# Patient Record
Sex: Female | Born: 1960 | Race: White | Hispanic: No | Marital: Married | Smoking: Never smoker
Health system: Southern US, Community
[De-identification: ages and names within clinical notes are randomized; demographics above are authoritative.]

## PROBLEM LIST (undated history)

## (undated) DIAGNOSIS — Z9889 Other specified postprocedural states: Secondary | ICD-10-CM

## (undated) DIAGNOSIS — R51 Headache: Secondary | ICD-10-CM

## (undated) DIAGNOSIS — R112 Nausea with vomiting, unspecified: Secondary | ICD-10-CM

## (undated) DIAGNOSIS — R7303 Prediabetes: Secondary | ICD-10-CM

## (undated) DIAGNOSIS — D219 Benign neoplasm of connective and other soft tissue, unspecified: Secondary | ICD-10-CM

## (undated) DIAGNOSIS — I10 Essential (primary) hypertension: Secondary | ICD-10-CM

## (undated) HISTORY — DX: Prediabetes: R73.03

## (undated) HISTORY — PX: EYE SURGERY: SHX253

## (undated) HISTORY — DX: Essential (primary) hypertension: I10

## (undated) HISTORY — DX: Benign neoplasm of connective and other soft tissue, unspecified: D21.9

## (undated) HISTORY — PX: BREAST REDUCTION SURGERY: SHX8

---

## 2000-08-19 HISTORY — PX: CYST EXCISION: SHX5701

## 2005-08-19 HISTORY — PX: CHOLECYSTECTOMY: SHX55

## 2011-05-20 HISTORY — PX: CATARACT EXTRACTION: SUR2

## 2012-09-08 DIAGNOSIS — K5792 Diverticulitis of intestine, part unspecified, without perforation or abscess without bleeding: Secondary | ICD-10-CM | POA: Insufficient documentation

## 2012-09-08 DIAGNOSIS — G43909 Migraine, unspecified, not intractable, without status migrainosus: Secondary | ICD-10-CM | POA: Insufficient documentation

## 2013-11-02 ENCOUNTER — Ambulatory Visit (INDEPENDENT_AMBULATORY_CARE_PROVIDER_SITE_OTHER): Payer: Commercial Managed Care - PPO | Admitting: Gynecology

## 2013-11-02 ENCOUNTER — Encounter: Payer: Self-pay | Admitting: Gynecology

## 2013-11-02 VITALS — BP 143/95 | HR 78 | Resp 18 | Ht 62.5 in | Wt 199.0 lb

## 2013-11-02 DIAGNOSIS — N970 Female infertility associated with anovulation: Secondary | ICD-10-CM | POA: Insufficient documentation

## 2013-11-02 DIAGNOSIS — Z8742 Personal history of other diseases of the female genital tract: Secondary | ICD-10-CM

## 2013-11-02 DIAGNOSIS — IMO0001 Reserved for inherently not codable concepts without codable children: Secondary | ICD-10-CM

## 2013-11-02 DIAGNOSIS — R030 Elevated blood-pressure reading, without diagnosis of hypertension: Secondary | ICD-10-CM

## 2013-11-02 DIAGNOSIS — Z01419 Encounter for gynecological examination (general) (routine) without abnormal findings: Secondary | ICD-10-CM

## 2013-11-02 DIAGNOSIS — E669 Obesity, unspecified: Secondary | ICD-10-CM | POA: Insufficient documentation

## 2013-11-02 DIAGNOSIS — Z0000 Encounter for general adult medical examination without abnormal findings: Secondary | ICD-10-CM

## 2013-11-02 DIAGNOSIS — Z1211 Encounter for screening for malignant neoplasm of colon: Secondary | ICD-10-CM

## 2013-11-02 DIAGNOSIS — N950 Postmenopausal bleeding: Secondary | ICD-10-CM | POA: Insufficient documentation

## 2013-11-02 LAB — HEMOGLOBIN, FINGERSTICK: Hemoglobin, fingerstick: 14.4 g/dL (ref 12.0–16.0)

## 2013-11-02 NOTE — Patient Instructions (Signed)

## 2013-11-02 NOTE — Progress Notes (Addendum)
53 y.o. Married Caucasian female   G0P0000 here for annual exam. Pt reports menses are irregular.  She does not report hot flashes, does not have night sweats, does have vaginal dryness.  She is using lubricants, [place] Jelly.  She does report post-menopasual bleeding.   Pt had an EMB 12/13 for PMB and did not bleed again until 2/15.  Pt describes the bleeding as lasting over 2w, bright red and now is brown.  Pt was wearing mostly pantiliner.  Pt had livelong irregular menses, husband with female factor infertility, pt denies treatment for her cycles.  Pt was having her cycle every 3-32m but sometimes twice a month.  Pt denies evaluation for PCOS or use of ocp or progestins.    Patient's last menstrual period was 10/04/2013.          Sexually active: yes  The current method of family planning is none.    Exercising: yes  walking 3-4x/wk Last pap: 12/13-neg, annual records reviewed 2010-2013 Abnormal PAP: no Mammogram: 03/2012 BSE: yes  Colonoscopy: none  DEXA: none Alcohol: no Tobacco: no  Hgb: 14.4  ; Urine: Unable to void  No health maintenance topics applied.  Family History  Problem Relation Age of Onset  . Cancer Mother     Ovarian/Cevical?  . Lung cancer Mother   . Lung cancer Father   . Lung cancer Maternal Uncle   . Heart attack Father 3  . Stroke Father     Late 5's  . Hypertension Father   . Heart attack Paternal Grandmother   . Congestive Heart Failure Paternal Grandmother     There are no active problems to display for this patient.   Past Medical History  Diagnosis Date  . Fibroid     Past Surgical History  Procedure Laterality Date  . Cataract extraction  05/2011  . Cholecystectomy  2007  . Cyst excision Left 2002    Sebaceous removed from Breast    Allergies: Codeine  Current Outpatient Prescriptions  Medication Sig Dispense Refill  . Cholecalciferol (VITAMIN D3) 2000 UNITS capsule Take 2,000 Units by mouth daily.      . fluticasone (FLONASE) 50  MCG/ACT nasal spray Place into both nostrils as needed for allergies or rhinitis.      Marland Kitchen MAGNESIUM PO Take by mouth.      . Multiple Vitamins-Minerals (MULTIVITAMIN PO) Take by mouth.      . Omega-3 Fatty Acids (FISH OIL PO) Take 1,000 mg by mouth.      . phentermine 37.5 MG capsule Take 37.5 mg by mouth every morning.      . SUMAtriptan (IMITREX) 100 MG tablet Take 100 mg by mouth as needed for migraine or headache. May repeat in 2 hours if headache persists or recurs.      . SUMAtriptan (IMITREX) 6 MG/0.5ML SOLN injection Inject 6 mg into the skin as needed for migraine or headache. May repeat in 2 hours if headache persists or recurs.       No current facility-administered medications for this visit.    ROS: Pertinent items are noted in HPI.  Exam:    BP 143/95  Pulse 78  Resp 18  Ht 5' 2.5" (1.588 m)  Wt 199 lb (90.266 kg)  BMI 35.80 kg/m2  LMP 10/04/2013 Weight change: @WEIGHTCHANGE @ Last 3 height recordings:  Ht Readings from Last 3 Encounters:  11/02/13 5' 2.5" (1.588 m)   General appearance: alert, cooperative and appears stated age Head: Normocephalic, without obvious abnormality, atraumatic  Neck: no adenopathy, no carotid bruit, no JVD, supple, symmetrical, trachea midline and thyroid not enlarged, symmetric, no tenderness/mass/nodules Lungs: clear to auscultation bilaterally Breasts: normal appearance, no masses or tenderness Heart: regular rate and rhythm, S1, S2 normal, no murmur, click, rub or gallop Abdomen: soft, non-tender; bowel sounds normal; no masses,  no organomegaly Extremities: extremities normal, atraumatic, no cyanosis or edema Skin: Skin color, texture, turgor normal. No rashes or lesions Lymph nodes: Cervical, supraclavicular, and axillary nodes normal. no inguinal nodes palpated Neurologic: Grossly normal   Pelvic: External genitalia:  no lesions              Urethra: normal appearing urethra with no masses, tenderness or lesions               Bartholins and Skenes: Bartholin's, Urethra, Skene's normal                 Vagina: normal appearing vagina with normal color and discharge, no lesions              Cervix: normal appearance              Pap taken: no        Bimanual Exam:  Uterus:  uterus is normal size, shape, consistency and nontender                                      Adnexa:    no masses                                      Rectovaginal: Confirms                                      Anus:  normal sphincter tone, no lesions  A: well woman Postmenopausal bleed Chronic anovulation Obesity Elevated BP on phnetiramine      P: mammogram pap smear not done, review of 5y neg PAP's.  New guidelines discussed, will be due 2016 Referral for colon cancer screening SHG/EMB recommended for PMB.  Discussed risks with pt of chronic anovulation, possible PCOS, obesity and infertility for endometrial hyperplasias and/or cancer.  Pt had PUS done with EMB 20mm, EMB results NA, we discussed need for repeat u/s and SHG added.  Based on findings biopsy may be done.  Pictures drawn.  Questions addressed Will get HBA1C due to weight and irregular menses Elevated BP today-pt on phentiramine for 48m, will see prescribing MD this week for f/u, lost 14#,  Pt reports being on in 2006 and lost 80#, had gained 50 back. Risks of BP disucssed counseled on breast self exam, mammography screening, menopause, adequate intake of calcium and vitamin D, diet and exercise return annually or prn Discussed PAP guideline changes, importance of weight bearing exercises, calcium, vit D and balanced diet.  Additional 66m spent discussing risks and evaluation of PMB and chronic anovulation, >50% face to face  An After Visit Summary was printed and given to the patient.   EMB results:  07/25/12-extremely scant strips of benign surface endometrial epithelium

## 2013-11-03 ENCOUNTER — Telehealth: Payer: Self-pay | Admitting: Gynecology

## 2013-11-03 LAB — HEMOGLOBIN A1C
Hgb A1c MFr Bld: 5.8 % — ABNORMAL HIGH (ref <5.7)
Mean Plasma Glucose: 120 mg/dL — ABNORMAL HIGH (ref <117)

## 2013-11-03 NOTE — Telephone Encounter (Signed)
Spoke with patient. Advised of $316.28 responsibility for [facility] and Endo [facility]. Scheduled procedures. Advised patient of cancellation policy and cancellation fee. Patient agreeable.

## 2013-11-03 NOTE — Addendum Note (Signed)
Addended by: Alfonzo Feller on: 11/03/2013 08:40 AM   Modules accepted: Orders

## 2013-11-09 ENCOUNTER — Telehealth: Payer: Self-pay | Admitting: Gynecology

## 2013-11-09 NOTE — Telephone Encounter (Signed)
Spoke with patient. Message from Dr. Charlies Constable given. Patient verbalizes understanding.   Routing to provider for final review. Patient agreeable to disposition. Will close encounter

## 2013-11-09 NOTE — Telephone Encounter (Signed)
Per Reuben Likes, patient is scheduled 04.16 @ 1600 with a nurse for referral visit.

## 2013-11-09 NOTE — Telephone Encounter (Signed)
Message copied by Jasmine Awe on Tue Nov 09, 2013  9:31 AM ------      Message from: Elveria Rising      Created: Thu Nov 04, 2013 10:28 AM       Not fasting but suggestive of pre-diabetic, can discuss at PUS if desires ------

## 2013-11-23 ENCOUNTER — Other Ambulatory Visit: Payer: Self-pay | Admitting: Gynecology

## 2013-11-23 ENCOUNTER — Encounter: Payer: Self-pay | Admitting: Gynecology

## 2013-11-23 ENCOUNTER — Ambulatory Visit (INDEPENDENT_AMBULATORY_CARE_PROVIDER_SITE_OTHER): Payer: Commercial Managed Care - PPO | Admitting: Gynecology

## 2013-11-23 ENCOUNTER — Ambulatory Visit (INDEPENDENT_AMBULATORY_CARE_PROVIDER_SITE_OTHER): Payer: Commercial Managed Care - PPO

## 2013-11-23 VITALS — BP 112/78 | Resp 20 | Ht 62.5 in | Wt 197.0 lb

## 2013-11-23 DIAGNOSIS — N950 Postmenopausal bleeding: Secondary | ICD-10-CM

## 2013-11-23 NOTE — Progress Notes (Signed)
U/s images reviewed. Uterus normal, EMS 2.45, ovaries c/w menopause, no free fluid Although lining is thin, pt did have 2w bleed and minimal tissue on prior biopsy, suggest we proceed with SHG and EMB.  Pt agrees and consent signed.  Speculum placed, cervix visualized, cleansed with betadine x3, insemination catheter advanced with ease and walls gently distended.  approx 1cc fluid used.  No endometrial defects noted.  Endometrial pipelle advanced with u/s guidance as min resistance met, minimal tissue obtained on 2 passes, pt tolerated well, tissue to pathology. Of note, BP better today, still on phentiramine 

## 2013-11-26 LAB — IPS OTHER TISSUE BIOPSY

## 2013-11-30 ENCOUNTER — Telehealth: Payer: Self-pay | Admitting: Gynecology

## 2013-11-30 NOTE — Telephone Encounter (Signed)
Pt returned call, expalined that endometrial biopsy showed n -PREDOMINANTLY MUCUS WITH INTERSPERSED FRAGMENTS OF BENIGN ATROPHIC-APPEARING ENDOMETRIAL GLANDS AND STRIPS OF BENIGN ATROPHIC-APPEARING ENDOMETRIAL GLANDULAR EPITHELIUM WITH CILIARY METAPLASIA; NO INTACT ENDOMETRIUM IDENTIFIED As this was her second bleed in menopause, and both mine and her prior biopsy did not produce sufficient information, i suggest we do a hysteroscopy, procedure and risks and benefits briefly reviewed and pt is agreeable, we will precert and discuss more formally before OR

## 2013-11-30 NOTE — Telephone Encounter (Signed)
LM RE RESULTS, ASKED TO CALL BACK

## 2013-11-30 NOTE — Telephone Encounter (Signed)
Opened in error

## 2013-12-06 ENCOUNTER — Telehealth: Payer: Self-pay | Admitting: Gynecology

## 2013-12-06 NOTE — Telephone Encounter (Signed)
Spoke with patient. Advised that per benefits quote received, she will be responsible for $129.48 for surgeons fees. Payment is due 2 weeks prior to the scheduled surgery date. She will receive separate phone call regarding facility charges/benefits. Patient prefers surgery after June 5th.

## 2014-01-05 ENCOUNTER — Telehealth: Payer: Self-pay | Admitting: Orthopedic Surgery

## 2014-01-05 NOTE — Telephone Encounter (Signed)
LMTCB for surgical instructions and scheduling appts.

## 2014-01-05 NOTE — Telephone Encounter (Signed)
Spoke with pt about surgery date of 01-24-14, and pt unable to accommodate that day. Pt says she is off the whole week of 02-14-14 and her only appt that week is 02-15-14 at 10 am. Advised pt that we only had instructions to put surgery after 01-21-14, and we would have to arrange another surgery date and call her back. Pt apologetic and appreciative.

## 2014-01-14 ENCOUNTER — Telehealth: Payer: Self-pay | Admitting: Gynecology

## 2014-01-14 NOTE — Telephone Encounter (Signed)
MAILED LETTER TO PATIENT  Jan 14, 2014   Dear Ms. Louis Meckel,  Your surgery is scheduled for February 14, 2014.  Upon requesting authorization for your surgery, your insurance company has informed us that they will cover 80% of the charges, and you will be responsible to pay approximately $129.48.  It is our office policy that this amount is paid in full two weeks prior to your surgery. Your payment is due on June 15.  If there is a balance due after your insurance company pays their portion, we will send you a bill.  If there is a refund due to you, we will send you a check within one month.  Payment may be made by cash, check, Visa, Nurse, learning disability. Payment can be made in the office or over the telephone.  If payment is not made two weeks prior to your surgery, we will have to reschedule your surgery.  The above fee includes only our fee for the surgery and does not include charges you may have from the facility, anesthesia or pathology.  If you have any questions, please call us at [phone].

## 2014-01-14 NOTE — Telephone Encounter (Signed)
Pt calling to schedule surgery.

## 2014-01-14 NOTE — Telephone Encounter (Signed)
Call to patient and advised surgery rescheduled to 02-14-14 at 1130 at [facility] (patient previously scheduled for 01-24-14 but declined this date). Patient states she has an appointmnet on 02-15-14 at 1000. Advised she should not drive for 24 hours post op and with surgery on 02-14-14, this would concern me.  Patient requests to change to 02-16-14.  Call to Central Scheduling. Spoke with [facility] and case moved to 02-16-14 at 1030.  Surgery instruction sheet reviewed and mailed.  Call prn.  Routing to provider for final review. Patient agreeable to disposition. Will close encounter

## 2014-01-21 HISTORY — PX: COLONOSCOPY: SHX174

## 2014-01-26 ENCOUNTER — Ambulatory Visit (INDEPENDENT_AMBULATORY_CARE_PROVIDER_SITE_OTHER): Payer: Commercial Managed Care - PPO | Admitting: Gynecology

## 2014-01-26 ENCOUNTER — Encounter: Payer: Self-pay | Admitting: Gynecology

## 2014-01-26 VITALS — BP 130/92 | HR 80 | Resp 18 | Ht 62.5 in | Wt 188.0 lb

## 2014-01-26 DIAGNOSIS — N950 Postmenopausal bleeding: Secondary | ICD-10-CM

## 2014-01-26 MED ORDER — MISOPROSTOL 200 MCG PO TABS: ORAL_TABLET | ORAL | Status: DC

## 2014-01-26 NOTE — Progress Notes (Signed)
  53 y.o.Married Not Hispanic or Latino female presents for preoperative consult for D&C hysteroscopy forabnormal uterine bleeding and post-menopausal.  Pt reports long history of irregular cycles, LMP 11/13, she had been seen by prior gyn for bleeding, although EMS was 70mm, her biopsy was insufficient, she was seen here reporting bleeding 2/15.  PUS showed ems 2.45, EMB with mucous and no intact endometrial cells.  Pt reports feeling like she is going to start her cycle now, crampy and mild mastalgia as well as change in vaginal discharge.   Pertinent items are noted in HPI.  BP 130/92  Pulse 80  Resp 18  Ht 5' 2.5" (1.588 m)  Wt 188 lb (85.276 kg)  BMI 33.82 kg/m2  LMP 10/04/2013 General appearance: alert, cooperative and appears stated age Lungs: clear to auscultation bilaterally Heart: regular rate and rhythm, S1, S2 normal, no murmur, click, rub or gallop Abdomen: soft, non-tender; bowel sounds normal; no masses,  no organomegaly Pelvic: cervix normal in appearance, external genitalia normal, no adnexal masses or tenderness, no cervical motion tenderness, uterus normal size, shape, and consistency and vagina normal without discharge Extremities: edema pedal Lymph nodes: Cervical, supraclavicular, and axillary nodes normal.  The procedure was discussed at length.  Her risks for dysplasia were reviewed, an/oligoovulation, obesity, infertility Pt informed regarding risks and benefits of surgery, including but not limited to bleeding, infections, damage to bowel or bladder due to uterine perforation either during the procedure or during dilation. cytotec will  be given preoperatively to soften the cervix.    There is a risk of formation of a deep vein thrombus in the extremities was discussed, although PAS will be placed to minimize the risk, the patient was informed that a pulmonary embolism could still form and could result in death.  She was instructed on signs and symptoms to be aware of  and the need to call if they should develop.  Fluid overload from the distending media was discussed as was the usual intra-operative safety precautions in place.  All questions were addressed.  Post-operative medications NSAID were not given to the patient.

## 2014-01-28 ENCOUNTER — Encounter: Payer: Self-pay | Admitting: Gynecology

## 2014-01-28 LAB — FOLLICLE STIMULATING HORMONE: FSH: 32.3 m[IU]/mL

## 2014-01-31 ENCOUNTER — Telehealth: Payer: Self-pay | Admitting: *Deleted

## 2014-01-31 NOTE — Telephone Encounter (Signed)
Message copied by Jaymes Graff on Mon Jan 31, 2014  5:06 PM ------      Message from: Elveria Rising      Created: Mon Jan 31, 2014 11:13 AM       Pt is canceling her surgery for July, can we reach her for a consult?      TL ------

## 2014-01-31 NOTE — Telephone Encounter (Signed)
Call to patient, confirmed she desires to cancel surgery since she is having menses on her own. Advised of recommendation to schedule consult to discuss treatment plan going forward. Brief explanation of why this is recommended, need to consider induction of cycle if no spontaneous bleeding every three months to prevent possible development of abnormal cells in uterus. Also may need to discuss any contraception needs. Patient will call back to schedule as she is going on vacation and will probably wait till then to make appointment.

## 2014-01-31 NOTE — Telephone Encounter (Signed)
See next phone message. Spoke to patient to confirm cancel surgery. Needs to schedule follow-up.  Routing to provider for final review. Patient agreeable to disposition. Will close encounter

## 2014-01-31 NOTE — Telephone Encounter (Signed)
Patient calling stating Dr. Charlies Constable told her through [place] she will not be needing surgery after all. Patient calling to cancel upcoming surgery and all associated appointments. Patient wants [place] to please let her know if there is anything further she needs to do. Patient also requests refund of $129.48 she paid for a surgery pre-pay.  Cc: Gabriel Cirri

## 2014-02-02 NOTE — Telephone Encounter (Signed)
Reviewed with Dr Charlies Constable who offered to change post op appointment to a consult. Patient is agreeable to this.  Appt 03-02-14 at 530 for consult  (instead of post-op).  Routing to provider for final review. Patient agreeable to disposition. Will close encounter

## 2014-02-14 ENCOUNTER — Encounter: Payer: Self-pay | Admitting: Gynecology

## 2014-02-16 ENCOUNTER — Ambulatory Visit (HOSPITAL_COMMUNITY): Admission: RE | Admit: 2014-02-16 | Payer: Self-pay | Source: Ambulatory Visit | Admitting: Gynecology

## 2014-02-16 ENCOUNTER — Encounter (HOSPITAL_COMMUNITY): Admission: RE | Payer: Self-pay | Source: Ambulatory Visit

## 2014-02-16 SURGERY — DILATATION & CURETTAGE/HYSTEROSCOPY WITH TRUCLEAR
Anesthesia: Choice

## 2014-02-22 ENCOUNTER — Encounter: Payer: Self-pay | Admitting: Gynecology

## 2014-02-23 ENCOUNTER — Telehealth: Payer: Self-pay | Admitting: Gynecology

## 2014-02-23 NOTE — Telephone Encounter (Signed)
Routing to Dr. Charlies Constable.

## 2014-02-23 NOTE — Telephone Encounter (Signed)
Patient canceled her dr/cx/rs/appointment for consult/follow up she states that day and time is not good for her. She did not reschedule her appointment at this time but stated she would call back at a later time to reschedule.

## 2014-03-02 ENCOUNTER — Ambulatory Visit: Payer: Commercial Managed Care - PPO | Admitting: Gynecology

## 2014-03-07 ENCOUNTER — Ambulatory Visit: Payer: Commercial Managed Care - PPO | Admitting: Gynecology

## 2014-03-10 NOTE — Telephone Encounter (Signed)
Per Dr. Charlies Constable, please call pt to offer a 5 PM appointment with her for a consult at patient's convenience.   Work number, did not leave message.  Message left to return call to [place] at [phone] on Mobile.

## 2014-03-11 NOTE — Telephone Encounter (Addendum)
Spoke with patient. Advised of message from Dr.Lathrop. Patient is very grateful. Patient would like appointment for Wednesday or Thursday in August. Advised Thursdays are Dr.Lathop's off day. Requesting appointment for Wednesday 8/12. Appointment scheduled for Wednesday 8/12 at 5pm with Dr.Lathrop.  Routing to provider for final review. Patient agreeable to disposition. Will close encounter

## 2014-03-30 ENCOUNTER — Ambulatory Visit (INDEPENDENT_AMBULATORY_CARE_PROVIDER_SITE_OTHER): Payer: Commercial Managed Care - PPO | Admitting: Gynecology

## 2014-03-30 ENCOUNTER — Encounter: Payer: Self-pay | Admitting: Gynecology

## 2014-03-30 VITALS — BP 148/98 | Resp 20 | Ht 62.5 in | Wt 189.0 lb

## 2014-03-30 DIAGNOSIS — N950 Postmenopausal bleeding: Secondary | ICD-10-CM

## 2014-03-30 DIAGNOSIS — Z8742 Personal history of other diseases of the female genital tract: Secondary | ICD-10-CM

## 2014-03-30 MED ORDER — MISOPROSTOL 200 MCG PO TABS: ORAL_TABLET | ORAL | Status: DC

## 2014-03-30 NOTE — Patient Instructions (Signed)
Take a cytotec in the evening before and morning of sugery

## 2014-03-30 NOTE — Progress Notes (Signed)
53 y.o.Married caucasian female presents for consultation.  She was scheduled for a D&C hysteroscopy for what was thought to be PMB but pt had an [place] in the upper normal range, had breast tenderness and did have a cycle so she cancelled. We asked her to come back for consultation today.  She has not had a cycle since June.   Pt reports long history of irregular cycles, prior LMP 11/13, she had been seen by prior gyn for bleeding, although EMS was 4mm, her biopsy was insufficient, she was seen here reporting bleeding 2/15. PUS showed ems 2.45, EMB with mucous and no intact endometrial cells. We discussed the risks or prolonged amenorrhea and anovulation, the effects of exogenous estrogen on her lining and what is felt to be 2 inadequate biopsies.  I strongly encourage her to reconsider surgical evaluation.  Pertinent items are noted in HPI.   BP 148/98  Pulse 80  Resp 18  Ht 5' 2.5" (1.588 m)  Wt 189 lb (85.276 kg)  BMI 33.82 kg/m2  LMP 06//2015  General appearance: alert, cooperative and appears stated age  Lungs: clear to auscultation bilaterally  Heart: regular rate and rhythm, S1, S2 normal, no murmur, click, rub or gallop  Abdomen: soft, non-tender; bowel sounds normal; no masses, no organomegaly  Pelvic: cervix normal in appearance, external genitalia normal, no adnexal masses or tenderness, no cervical motion tenderness, uterus normal size, shape, and consistency and vagina normal without discharge  Extremities: edema pedal    The procedure was discussed at length. Her risks for dysplasia were reviewed, an/oligoovulation, obesity, infertility  Pt informed regarding risks and benefits of surgery, including but not limited to bleeding, infections, damage to bowel or bladder due to uterine perforation either during the procedure or during dilation. cytotec will be given preoperatively to soften the cervix.  There is a risk of formation of a deep vein thrombus in the extremities was  discussed, although PAS will be placed to minimize the risk, the patient was informed that a pulmonary embolism could still form and could result in death. She was instructed on signs and symptoms to be aware of and the need to call if they should develop.  Fluid overload from the distending media was discussed as was the usual intra-operative safety precautions in place.  If her pathology is normal i suggest we treat her periodically with progestin to shed any residual lining until her [facility] is in menopausal range and she is agreeable.  All questions were addressed.

## 2014-03-31 ENCOUNTER — Telehealth: Payer: Self-pay | Admitting: *Deleted

## 2014-03-31 NOTE — Telephone Encounter (Signed)
Call to patient regarding surgery dates. Patient discussed 04-29-14 with Dr Charlies Constable but hospital is not able to accommodate this date.  Discussed other date options. Requests a Friday 04-29-14 anytime this day, explained hosp schedule and Dr lathrop schedule do not work that day. [place] declines Friday 05-06-14. Will post for 05-13-14 at 730 and call her back with precert information once case confirmed.

## 2014-04-12 ENCOUNTER — Encounter: Payer: Self-pay | Admitting: Gynecology

## 2014-04-12 NOTE — Telephone Encounter (Signed)
Patient returned call. I explained to her that per the benefit quote received, she will be responsible for $129.87 for the surgeon portion of her surgery. I also explained that per our office policy, this payment is due in full at least 2 weeks prior to her scheduled surgery date. Surgery is scheduled for 09.25.2015.Marland KitchenMarland Kitchenpayment is due 09.11.2015. Patient agreeable. States that she will call in this payment.

## 2014-04-12 NOTE — Telephone Encounter (Signed)
Left message for patient to call back. Need to go over surgery benefits. °

## 2014-04-18 NOTE — Telephone Encounter (Signed)
MAILED SURGERY FEE LETTER TO PATIENT:: April 18, 2014      Dear Ms. Wendie Simmer,  Your surgery is scheduled May 13, 2014.   Upon requesting authorization for your surgery, your insurance company has informed us that they will cover 80% of the charges, and you will be responsible to pay approximately $129.87 . It is our office policy that this amount is paid in full two weeks prior to your surgery. Your payment is due September 11, 2015_.  If there is a balance due after your insurance company pays their portion, we will send you a bill.  If there is a refund due to you, we will send you a check within one month.  Payment may be made by cash, check, Visa, MasterCard or American Express.  If payment is not made two weeks prior to your surgery, we will have to reschedule your surgery.  The above fee includes only our fee for the surgery and does not include charges you may have from the facility, anesthesia or pathology.  If you have any questions, please call us at [phone].

## 2014-04-27 NOTE — Telephone Encounter (Signed)
Review surgery information and instruction with the pt.  Mailed copy to the address in chart

## 2014-04-28 NOTE — Telephone Encounter (Signed)
Pt wants to talk with Gay Filler about a medication she needs to take for surgery.

## 2014-04-29 ENCOUNTER — Encounter (HOSPITAL_COMMUNITY): Payer: Self-pay

## 2014-04-29 NOTE — Telephone Encounter (Signed)
Return call to patient. Calling to confirm instruction regarding pill for surgery. Instructed to take the one Cytotec evening before procedure. Advised surgery has been moved to 9am on 05-13-14 (time change only).  Routing to provider for final review. Patient agreeable to disposition. Will close encounter

## 2014-04-29 NOTE — Telephone Encounter (Signed)
Return call to patient, LMTCB.  

## 2014-04-29 NOTE — Telephone Encounter (Signed)
Patient will try back again at 3:00 PM.

## 2014-05-09 ENCOUNTER — Encounter (HOSPITAL_COMMUNITY)
Admission: RE | Admit: 2014-05-09 | Discharge: 2014-05-09 | Disposition: A | Discharge status: Home / Self Care | Payer: 59 | Source: Ambulatory Visit | Attending: Gynecology | Admitting: Gynecology

## 2014-05-09 ENCOUNTER — Encounter (HOSPITAL_COMMUNITY): Payer: Self-pay

## 2014-05-09 DIAGNOSIS — N938 Other specified abnormal uterine and vaginal bleeding: Secondary | ICD-10-CM | POA: Insufficient documentation

## 2014-05-09 DIAGNOSIS — N949 Unspecified condition associated with female genital organs and menstrual cycle: Secondary | ICD-10-CM | POA: Insufficient documentation

## 2014-05-09 DIAGNOSIS — Z01812 Encounter for preprocedural laboratory examination: Secondary | ICD-10-CM | POA: Insufficient documentation

## 2014-05-09 DIAGNOSIS — N925 Other specified irregular menstruation: Secondary | ICD-10-CM | POA: Diagnosis not present

## 2014-05-09 HISTORY — DX: Nausea with vomiting, unspecified: R11.2

## 2014-05-09 HISTORY — DX: Headache: R51

## 2014-05-09 HISTORY — DX: Other specified postprocedural states: Z98.890

## 2014-05-09 LAB — CBC
HCT: 40.6 % (ref 36.0–46.0)
Hemoglobin: 13.9 g/dL (ref 12.0–15.0)
MCH: 31 pg (ref 26.0–34.0)
MCHC: 34.2 g/dL (ref 30.0–36.0)
MCV: 90.6 fL (ref 78.0–100.0)
Platelets: 345 10*3/uL (ref 150–400)
RBC: 4.48 MIL/uL (ref 3.87–5.11)
RDW: 12.6 % (ref 11.5–15.5)
WBC: 8.8 10*3/uL (ref 4.0–10.5)

## 2014-05-09 NOTE — Patient Instructions (Addendum)
   Your procedure is scheduled on:  Friday, Sept 25  Enter through the [employer] [facility] at: [place] up the phone at the desk and dial [phone] and inform us of your arrival.  Please call this number if you have any problems the morning of surgery: [phone]  Remember: Do not eat or drink after midnight:  Thursday Take these medicines the morning of surgery with a SIP OF WATER:  None  Do not wear jewelry, make-up, or FINGER nail polish No metal in your hair or on your body. Do not wear lotions, powders, perfumes.  You may wear deodorant.  Do not bring valuables to the hospital. Contacts, dentures or bridgework may not be worn into surgery.  Patients discharged on the day of surgery will not be allowed to drive home.  Home with husband Joneen Caraway cell [phone]

## 2014-05-13 ENCOUNTER — Encounter (HOSPITAL_COMMUNITY): Payer: 59 | Admitting: Anesthesiology

## 2014-05-13 ENCOUNTER — Encounter (HOSPITAL_COMMUNITY)
Admission: RE | Disposition: A | Discharge status: Home / Self Care | Payer: Self-pay | Source: Ambulatory Visit | Attending: Gynecology

## 2014-05-13 ENCOUNTER — Ambulatory Visit (HOSPITAL_COMMUNITY)
Admission: RE | Admit: 2014-05-13 | Discharge: 2014-05-13 | Disposition: A | Discharge status: Home / Self Care | Payer: 59 | Source: Ambulatory Visit | Attending: Gynecology | Admitting: Gynecology

## 2014-05-13 ENCOUNTER — Ambulatory Visit (HOSPITAL_COMMUNITY): Payer: 59 | Admitting: Anesthesiology

## 2014-05-13 DIAGNOSIS — E282 Polycystic ovarian syndrome: Secondary | ICD-10-CM | POA: Insufficient documentation

## 2014-05-13 DIAGNOSIS — Z9889 Other specified postprocedural states: Secondary | ICD-10-CM

## 2014-05-13 DIAGNOSIS — N938 Other specified abnormal uterine and vaginal bleeding: Secondary | ICD-10-CM

## 2014-05-13 DIAGNOSIS — N950 Postmenopausal bleeding: Secondary | ICD-10-CM | POA: Insufficient documentation

## 2014-05-13 DIAGNOSIS — N949 Unspecified condition associated with female genital organs and menstrual cycle: Secondary | ICD-10-CM

## 2014-05-13 HISTORY — PX: HYSTEROSCOPY: SHX211

## 2014-05-13 SURGERY — HYSTEROSCOPY
Anesthesia: General

## 2014-05-13 MED ORDER — ONDANSETRON HCL 4 MG/2ML IJ SOLN
INTRAMUSCULAR | Status: AC
  Filled 2014-05-13: qty 2

## 2014-05-13 MED ORDER — ONDANSETRON HCL 4 MG/2ML IJ SOLN
INTRAMUSCULAR | Status: DC | PRN
  Administered 2014-05-13: 4 mg via INTRAVENOUS

## 2014-05-13 MED ORDER — HYDROMORPHONE HCL 1 MG/ML IJ SOLN
INTRAMUSCULAR | Status: AC
  Filled 2014-05-13: qty 1

## 2014-05-13 MED ORDER — LIDOCAINE HCL (CARDIAC) 20 MG/ML IV SOLN
INTRAVENOUS | Status: DC | PRN
  Administered 2014-05-13: 50 mg via INTRAVENOUS

## 2014-05-13 MED ORDER — SCOPOLAMINE 1 MG/3DAYS TD PT72
MEDICATED_PATCH | TRANSDERMAL | Status: AC
  Administered 2014-05-13: 1.5 mg
  Filled 2014-05-13: qty 1

## 2014-05-13 MED ORDER — LACTATED RINGERS IV SOLN
INTRAVENOUS | Status: DC
  Administered 2014-05-13 (×2): via INTRAVENOUS

## 2014-05-13 MED ORDER — BUPIVACAINE HCL (PF) 0.25 % IJ SOLN
INTRAMUSCULAR | Status: AC
  Filled 2014-05-13: qty 30

## 2014-05-13 MED ORDER — DEXAMETHASONE SODIUM PHOSPHATE 10 MG/ML IJ SOLN
INTRAMUSCULAR | Status: DC | PRN
  Administered 2014-05-13: 10 mg via INTRAVENOUS

## 2014-05-13 MED ORDER — FENTANYL CITRATE 0.05 MG/ML IJ SOLN
INTRAMUSCULAR | Status: DC | PRN
  Administered 2014-05-13: 100 ug via INTRAVENOUS

## 2014-05-13 MED ORDER — KETOROLAC TROMETHAMINE 15 MG/ML IJ SOLN
INTRAMUSCULAR | Status: DC | PRN
  Administered 2014-05-13: 30 mg via INTRAVENOUS

## 2014-05-13 MED ORDER — FENTANYL CITRATE 0.05 MG/ML IJ SOLN: 25.0000 ug | INTRAMUSCULAR | Status: DC | PRN

## 2014-05-13 MED ORDER — KETOROLAC TROMETHAMINE 30 MG/ML IJ SOLN
INTRAMUSCULAR | Status: AC
  Filled 2014-05-13: qty 1

## 2014-05-13 MED ORDER — DEXAMETHASONE SODIUM PHOSPHATE 10 MG/ML IJ SOLN
INTRAMUSCULAR | Status: AC
  Filled 2014-05-13: qty 1

## 2014-05-13 MED ORDER — LIDOCAINE HCL 2 % IJ SOLN
INTRAMUSCULAR | Status: DC | PRN
  Administered 2014-05-13: 10 mL

## 2014-05-13 MED ORDER — PROPOFOL 10 MG/ML IV BOLUS
INTRAVENOUS | Status: DC | PRN
  Administered 2014-05-13: 180 mg via INTRAVENOUS
  Administered 2014-05-13: 20 mg via INTRAVENOUS

## 2014-05-13 MED ORDER — PROPOFOL 10 MG/ML IV EMUL
INTRAVENOUS | Status: AC
  Filled 2014-05-13: qty 20

## 2014-05-13 MED ORDER — FENTANYL CITRATE 0.05 MG/ML IJ SOLN
INTRAMUSCULAR | Status: AC
  Filled 2014-05-13: qty 2

## 2014-05-13 MED ORDER — LIDOCAINE HCL (CARDIAC) 20 MG/ML IV SOLN
INTRAVENOUS | Status: AC
  Filled 2014-05-13: qty 5

## 2014-05-13 MED ORDER — MIDAZOLAM HCL 2 MG/2ML IJ SOLN
INTRAMUSCULAR | Status: DC | PRN
  Administered 2014-05-13: 2 mg via INTRAVENOUS

## 2014-05-13 MED ORDER — SCOPOLAMINE 1 MG/3DAYS TD PT72: 1.0000 | MEDICATED_PATCH | Freq: Once | TRANSDERMAL | Status: DC

## 2014-05-13 MED ORDER — LIDOCAINE HCL 2 % IJ SOLN
INTRAMUSCULAR | Status: AC
  Filled 2014-05-13: qty 20

## 2014-05-13 MED ORDER — GLYCINE 1.5 % IR SOLN
Status: DC | PRN
  Administered 2014-05-13: 3000 mL

## 2014-05-13 MED ORDER — MIDAZOLAM HCL 2 MG/2ML IJ SOLN
INTRAMUSCULAR | Status: AC
  Filled 2014-05-13: qty 2

## 2014-05-13 MED ORDER — HYDROMORPHONE HCL 1 MG/ML IJ SOLN
INTRAMUSCULAR | Status: DC | PRN
  Administered 2014-05-13: 1 mg via INTRAVENOUS

## 2014-05-13 SURGICAL SUPPLY — 19 items
CATH ROBINSON RED A/P 16FR (CATHETERS) ×2 IMPLANT
CONTAINER PREFILL 10% NBF 60ML (FORM) ×4 IMPLANT
DRAPE HYSTEROSCOPY (DRAPE) ×2 IMPLANT
ELECT REM PT RETURN 9FT ADLT (ELECTROSURGICAL) ×2
ELECTRODE REM PT RTRN 9FT ADLT (ELECTROSURGICAL) ×1 IMPLANT
GLOVE BIOGEL M 6.5 STRL (GLOVE) ×2 IMPLANT
GLOVE BIOGEL PI IND STRL 6.5 (GLOVE) ×1 IMPLANT
GLOVE BIOGEL PI INDICATOR 6.5 (GLOVE) ×1
GOWN STRL REUS W/TWL LRG LVL3 (GOWN DISPOSABLE) ×4 IMPLANT
LOOP ANGLED CUTTING 22FR (CUTTING LOOP) IMPLANT
NEEDLE HYPO 21X1.5 SAFETY (NEEDLE) IMPLANT
NEEDLE SPNL 22GX3.5 QUINCKE BK (NEEDLE) ×2 IMPLANT
PACK VAGINAL MINOR WOMEN LF (CUSTOM PROCEDURE TRAY) ×2 IMPLANT
PAD OB MATERNITY 4.3X12.25 (PERSONAL CARE ITEMS) ×2 IMPLANT
SET TUBING HYSTEROSCOPY 2 NDL (TUBING) ×2 IMPLANT
SYR CONTROL 10ML LL (SYRINGE) ×2 IMPLANT
TOWEL OR 17X24 6PK STRL BLUE (TOWEL DISPOSABLE) ×4 IMPLANT
TUBE HYSTEROSCOPY W Y-CONNECT (TUBING) ×2 IMPLANT
WATER STERILE IRR 1000ML POUR (IV SOLUTION) ×2 IMPLANT

## 2014-05-13 NOTE — Discharge Instructions (Signed)
DISCHARGE INSTRUCTIONS: HYSTEROSCOPY  The following instructions have been prepared to help you care for yourself upon your return home.  Personal hygiene:  Use sanitary pads for vaginal drainage, not tampons.  Shower the day after your procedure.  NO tub baths, pools or Jacuzzis for 2-3 weeks.  Wipe front to back after using the bathroom.  Activity and limitations:  Do NOT drive or operate any equipment for 24 hours. The effects of anesthesia are still present and drowsiness may result.  Do NOT rest in bed all day.  Walking is encouraged.  Walk up and down stairs slowly.  You may resume your normal activity in one to two days or as indicated by your physician. Sexual activity: NO intercourse for at least 2 weeks after the procedure, or as indicated by your Doctor.  Diet: Eat a light meal as desired this evening. You may resume your usual diet tomorrow.  Return to Work: You may resume your work activities in one to two days or as indicated by Marine scientist.  What to expect after your surgery: Expect to have vaginal bleeding/discharge for 2-3 days and spotting for up to 10 days. It is not unusual to have soreness for up to 1-2 weeks. You may have a slight burning sensation when you urinate for the first day. Mild cramps may continue for a couple of days. You may have a regular period in 2-6 weeks.  NO IBUPROFEN PRODUCTS (MOTRIN,ADVIL) OR ALEVE UNTIL 3:50 PM TODAY.   Call your doctor for any of the following:  Excessive vaginal bleeding or clotting, saturating and changing one pad every hour.  Inability to urinate 6 hours after discharge from hospital.  Pain not relieved by pain medication.  Fever of 100.4 F or greater.  Unusual vaginal discharge or odor.  Return to office _________________Call for an appointment ___________________ Patients signature: ______________________ Nurses signature ________________________  [facility] [phone]

## 2014-05-13 NOTE — Anesthesia Postprocedure Evaluation (Signed)
  Anesthesia Post-op Note  Patient: Angela Garcia  Procedure(s) Performed: Procedure(s): HYSTEROSCOPY with truclear   (N/A) Patient is awake and responsive. Pain and nausea are reasonably well controlled. Vital signs are stable and clinically acceptable. Oxygen saturation is clinically acceptable. There are no apparent anesthetic complications at this time. Patient is ready for discharge.

## 2014-05-13 NOTE — Brief Op Note (Signed)
05/13/2014  10:15 AM  PATIENT:  Angela Garcia  53 y.o. female  PRE-OPERATIVE DIAGNOSIS:  DUB  POST-OPERATIVE DIAGNOSIS:  DUB  PROCEDURE:  Procedure(s): HYSTEROSCOPY with truclear   (N/A)  SURGEON:  Surgeon(s) and Role:    * Azalia Bilis, MD - Primary  PHYSICIAN ASSISTANT:   ASSISTANTS: none   ANESTHESIA:   MAC  EBL:  Total I/O In: 1100 [I.V.:1100] Out: 255 [Urine:250; Blood:5]  BLOOD ADMINISTERED:none  DRAINS: none   LOCAL MEDICATIONS USED:  LIDOCAINE  and Amount: 10 ml  SPECIMEN:  Source of Specimen:  uterus  DISPOSITION OF SPECIMEN:  PATHOLOGY  COUNTS:  YES  TOURNIQUET:  * No tourniquets in log *  DICTATION: .Other Dictation: Dictation Number [id number]  PLAN OF CARE: Discharge to home after PACU  PATIENT DISPOSITION:  PACU - hemodynamically stable.   Delay start of Pharmacological VTE agent (>24hrs) due to surgical blood loss or risk of bleeding: not applicable

## 2014-05-13 NOTE — Op Note (Signed)
NAMEDEBERAH, ADOLF             ACCOUNT NO.:  [id number]  MEDICAL RECORD NO.:  [id number]  LOCATION:  [facility]                          FACILITY:  [facility]  PHYSICIAN:  Alver Sorrow. Charlies Constable, M.D. DATE OF BIRTH:  28-Mar-1961  DATE OF PROCEDURE:  05/13/2014 DATE OF DISCHARGE:  05/13/2014                              OPERATIVE REPORT   PREOPERATIVE DIAGNOSIS:  Dysfunctional bleeding.  POSTOPERATIVE DIAGNOSIS:  Dysfunctional bleeding.  PROCEDURE:  D and C, hysteroscopy.  SURGEON:  Alver Sorrow. Charlies Constable, M.D.  ANESTHESIA:  MAC.  IV FLUIDS:  100 mL lactated Ringer's.  URINE OUTPUT:  250.  ESTIMATED BLOOD LOSS:  5.  I AND O DEFICIT:  Normal saline solution was less than 25.  INDICATIONS:  The patient is a 53 year old with PCOS and long periods and ovulatory bleeding who has had 2 endometrial biopsies, both for insufficient material who now presents for diagnostic biopsy.  FINDINGS:  Normal-appearing cavity, lining appeared smooth without defect.  The ostia were visualized.  The uterus sounded to 7.  There were no gross cervical defects.  PATHOLOGY:  Uterine curettage.  COMPLICATIONS:  None.  DESCRIPTION OF PROCEDURE:  The patient was taken to the operating room, placed in dorsal lithotomy position, prepped and draped in usual sterile fashion.  Bimanual exam was performed.  Orientation of the uterus was confirmed.  A bivalve speculum was placed in the vagina.  The cervix was visualized, stabilized with a single-tooth tenaculum.  The cervix was gently dilated up to 19-French after which the TRUCLEAR hysteroscope was advanced through the cervix and into the cavity.  The findings were as noted above.  The slow retrieve of the hysteroscope failed to show any endometrial or cervical defects.  Sharp curettage was then performed. Sufficient tissue for diagnosis was obtained.  The hysteroscope was then re-advanced into the cavity, no defects were noted.  The patient tolerated the procedure well.   Sponge, lap, needle counts were correct x2.  She was extubated in the OR, transferred to the recovery room in stable condition.  At the end of the procedure, the patient received a paracervical block with 10 mL of 2% lidocaine.     Alver Sorrow. Charlies Constable, M.D.     THL/MEDQ  D:  05/13/2014  T:  05/13/2014  Job:  [id number]

## 2014-05-13 NOTE — H&P (Addendum)
Angela Garcia is an 53 y.o. female. Here for [facility] hysteroscopy for PMB.  Pt has a long history of irregular cycles, PCOS.  Pt reports bleeding 11/13, she had been seen by prior gyn for bleeding, although EMS was 77mm, her biopsy was insufficient, she was seen here reporting bleeding 2/15. PUS showed ems 2.45, EMB with mucous and no intact endometrial cells. Pt reports feeling like she is going to start her cycle now, crampy and mild mastalgia as well as change in vaginal discharge. She was scheduled for procedure in 02/2013, but canceled as she did get her cycle and her [place] was 32.  Pt has never been treated with ocp or progestins for her long lapses in bleeding.  She now presents for [facility] hysteroscopy to rule out endometrial pathology as she has had 2 insufficient biopsies     Pertinent Gynecological History: Menses: post-menopausal Bleeding: post menopausal bleeding Contraception: none DES exposure: denies Blood transfusions: none Sexually transmitted diseases: no past history Previous GYN Procedures: none  Last mammogram: normal Date: 2013 Last pap: normal Date: 2013 OB History: G0, P0   Menstrual History:  Patient's last menstrual period was 10/04/2013.    Past Medical History  Diagnosis Date  . Fibroid   . PONV (postoperative nausea and vomiting)   . Headache(784.0)     history - last one 4 months ago    Past Surgical History  Procedure Laterality Date  . Cataract extraction  05/2011    Bilateral  . Cholecystectomy  2007  . Cyst excision Left 2002    Sebaceous removed from Breast - left benign  . Colonoscopy  01/21/14    Normal - Every 10 years  . Eye surgery      Family History  Problem Relation Age of Onset  . Cancer Mother     Ovarian/Cevical?  . Lung cancer Mother   . Lung cancer Father   . Lung cancer Maternal Uncle   . Heart attack Father 21  . Stroke Father     Late 80's  . Hypertension Father   . Heart attack Paternal Grandmother   . Congestive Heart  Failure Paternal Grandmother     Social History:  reports that she has never smoked. She has never used smokeless tobacco. She reports that she drinks alcohol. She reports that she does not use illicit drugs.  Allergies:  Allergies  Allergen Reactions  . Codeine Nausea And Vomiting    Prescriptions prior to admission  Medication Sig Dispense Refill  . misoprostol (CYTOTEC) 200 MCG tablet 1 po evening before procedure  1 tablet  0  . phentermine 37.5 MG capsule Take 37.5 mg by mouth daily.      . Cholecalciferol (VITAMIN D3) 2000 UNITS capsule Take 2,000 Units by mouth daily.      . fluticasone (FLONASE) 50 MCG/ACT nasal spray Place 1 spray into both nostrils as needed for allergies or rhinitis.       Marland Kitchen MAGNESIUM PO Take 400 mg by mouth daily.       . Multiple Vitamins-Minerals (MULTIVITAMIN PO) Take 1 tablet by mouth.       . Omega-3 Fatty Acids (FISH OIL PO) Take 1,000 mg by mouth daily.         ROS  Blood pressure 147/74, pulse 71, temperature 98.2 F (36.8 C), temperature source Oral, resp. rate 16, last menstrual period 10/04/2013, SpO2 100.00%. Physical Exam BP 147/74  Pulse 71  Temp(Src) 98.2 F (36.8 C) (Oral)  Resp 16  SpO2  100%  LMP 10/04/2013 General appearance: alert, cooperative and appears stated age Lungs: clear to auscultation bilaterally Heart: regular rate and rhythm, S1, S2 normal, no murmur, click, rub or gallop Abdomen: soft, non-tender; bowel sounds normal; no masses,  no organomegaly Skin: Skin color, texture, turgor normal. No rashes or lesions Pelvic deferred to OR No results found for this or any previous visit (from the past 24 hour(s)).  No results found.  Assessment/Plan: D&C hysteroscopy for prolonged anovulatory bleeding in perimenopause Pt to take cytotec for cervical ripening before OR Pt has had pre-op for initial surgery and declined need for repeat visit  Portia 05/13/2014, 8:17 AM

## 2014-05-13 NOTE — Anesthesia Preprocedure Evaluation (Signed)

## 2014-05-13 NOTE — Transfer of Care (Signed)
Immediate Anesthesia Transfer of Care Note  Patient: Angela Garcia  Procedure(s) Performed: Procedure(s): HYSTEROSCOPY with truclear   (N/A)  Patient Location: PACU  Anesthesia Type:General  Level of Consciousness: awake, alert  and oriented  Airway & Oxygen Therapy: Patient Spontanous Breathing and Patient connected to nasal cannula oxygen  Post-op Assessment: Report given to PACU RN, Post -op Vital signs reviewed and stable and Patient moving all extremities  Post vital signs: Reviewed and stable  Complications: No apparent anesthesia complications

## 2014-05-16 ENCOUNTER — Encounter (HOSPITAL_COMMUNITY): Payer: Self-pay | Admitting: Gynecology

## 2014-05-17 ENCOUNTER — Telehealth: Payer: Self-pay

## 2014-05-17 NOTE — Telephone Encounter (Signed)
Spoke with patient. Advised patient of results as seen below. Patient is agreeable and verbalizes understanding.  Routing to provider for final review. Patient agreeable to disposition. Will close encounter   

## 2014-05-17 NOTE — Telephone Encounter (Signed)
Message copied by Jasmine Awe on Tue May 17, 2014  3:49 PM ------      Message from: Elveria Rising      Created: Tue May 17, 2014 10:13 AM       Inform biopsy is benign, atrophic! Endometrium. great ------

## 2014-05-30 ENCOUNTER — Ambulatory Visit (INDEPENDENT_AMBULATORY_CARE_PROVIDER_SITE_OTHER): Payer: Commercial Managed Care - PPO | Admitting: Gynecology

## 2014-05-30 VITALS — BP 134/80 | HR 82 | Resp 14 | Ht 62.5 in | Wt 193.0 lb

## 2014-05-30 DIAGNOSIS — Z8742 Personal history of other diseases of the female genital tract: Secondary | ICD-10-CM

## 2014-05-30 DIAGNOSIS — N950 Postmenopausal bleeding: Secondary | ICD-10-CM

## 2014-05-30 NOTE — Progress Notes (Signed)
Subjective:     Patient ID: Angela Garcia, female   DOB: 08-12-1961, 53 y.o.   MRN: [id number]  HPI Comments: Pt here for 2w follow up after D&C hysteroscopy for PMB and insufficient biopsies.  Pt is without complaints, no bleeding or pain afterwards.  Overall is pleased    Review of Systems Per HPI    Objective:   Physical Exam  Nursing note and vitals reviewed. Constitutional: She is oriented to person, place, and time. She appears well-developed and well-nourished.  Genitourinary:   Pelvic: External genitalia:  no lesions              Urethra:  normal appearing urethra with no masses, tenderness or lesions              Bartholins and Skenes: normal                 Vagina: normal appearing vagina with normal color and discharge, no lesions              Cervix: normal appearance                     Bimanual Exam:  Uterus:  uterus is normal size, shape, consistency and nontender                                       Adnexa: normal adnexa in size, nontender and no masses                                        Neurological: She is alert and oriented to person, place, and time.       Assessment:     Doing well     Plan:     Operative findings and pathology reviewed Pt aware that if she has bleeding she will still need to call   Reminded re mammogram-overdue

## 2014-06-08 ENCOUNTER — Other Ambulatory Visit: Payer: Self-pay

## 2014-06-08 ENCOUNTER — Encounter: Payer: Self-pay | Admitting: Gynecology

## 2014-06-08 DIAGNOSIS — Z1239 Encounter for other screening for malignant neoplasm of breast: Secondary | ICD-10-CM

## 2014-06-27 ENCOUNTER — Ambulatory Visit
Admission: RE | Admit: 2014-06-27 | Discharge: 2014-06-27 | Disposition: A | Discharge status: Home / Self Care | Payer: 59 | Source: Ambulatory Visit

## 2014-06-27 ENCOUNTER — Other Ambulatory Visit: Payer: Self-pay

## 2014-06-27 DIAGNOSIS — Z1231 Encounter for screening mammogram for malignant neoplasm of breast: Secondary | ICD-10-CM

## 2014-07-19 ENCOUNTER — Telehealth: Payer: Self-pay

## 2014-07-19 NOTE — Telephone Encounter (Signed)
Spoke with pt about rescheduling her AEX with Dr. Charlies Constable. Pt did not want to schedule with another provider. She stated she would call if she decided to stay with this practice.

## 2014-11-04 ENCOUNTER — Ambulatory Visit: Payer: Self-pay | Admitting: Gynecology

## 2014-12-03 ENCOUNTER — Emergency Department (HOSPITAL_COMMUNITY)
Admission: EM | Admit: 2014-12-03 | Discharge: 2014-12-03 | Disposition: A | Discharge status: Home / Self Care | Payer: 59 | Source: Home / Self Care | Attending: Family Medicine | Admitting: Family Medicine

## 2014-12-03 ENCOUNTER — Encounter (HOSPITAL_COMMUNITY): Payer: Self-pay | Admitting: *Deleted

## 2014-12-03 DIAGNOSIS — M25561 Pain in right knee: Secondary | ICD-10-CM | POA: Diagnosis not present

## 2014-12-03 MED ORDER — DICLOFENAC SODIUM 1 % TD GEL: 4.0000 g | Freq: Four times a day (QID) | TRANSDERMAL | Status: DC

## 2014-12-03 NOTE — ED Notes (Signed)
Pt  Reports   Pain       r      Knee      With   Swelling        Unable  To  Bear  Weight           Pt  Reports      Was   Working     In  [employer]

## 2014-12-03 NOTE — Discharge Instructions (Signed)
Ice, medicine and brace for comfort, activity as tolerated. See orthopedist if further problems.

## 2014-12-03 NOTE — ED Provider Notes (Signed)
CSN: [id number]     Arrival date & time 12/03/14  1504 History   First MD Initiated Contact with Patient 12/03/14 1554     Chief Complaint  Patient presents with  . Knee Pain   (Consider location/radiation/quality/duration/timing/severity/associated sxs/prior Treatment) Patient is a 54 y.o. female presenting with knee pain. The history is provided by the patient and the spouse.  Knee Pain Location:  Knee Time since incident:  1 day Injury: no   Knee location:  R knee Pain details:    Quality:  Dull   Radiates to:  Does not radiate   Severity:  Mild   Progression:  Unchanged Chronicity:  New Dislocation: no   Prior injury to area:  No Associated symptoms: decreased ROM, stiffness and swelling   Associated symptoms: no numbness     Past Medical History  Diagnosis Date  . Fibroid   . PONV (postoperative nausea and vomiting)   . Headache(784.0)     history - last one 4 months ago   Past Surgical History  Procedure Laterality Date  . Cataract extraction  05/2011    Bilateral  . Cholecystectomy  2007  . Cyst excision Left 2002    Sebaceous removed from Breast - left benign  . Colonoscopy  01/21/14    Normal - Every 10 years  . Eye surgery    . Hysteroscopy N/A 05/13/2014    Procedure: HYSTEROSCOPY with truclear  ;  Surgeon: Azalia Bilis, MD;  Location: [facility];  Service: Gynecology;  Laterality: N/A;   Family History  Problem Relation Age of Onset  . Cancer Mother     Ovarian/Cevical?  . Lung cancer Mother   . Lung cancer Father   . Lung cancer Maternal Uncle   . Heart attack Father 62  . Stroke Father     Late 74's  . Hypertension Father   . Heart attack Paternal Grandmother   . Congestive Heart Failure Paternal Grandmother    History  Substance Use Topics  . Smoking status: Never Smoker   . Smokeless tobacco: Never Used  . Alcohol Use: Yes     Comment: social   OB History    Gravida Para Term Preterm AB TAB SAB Ectopic Multiple Living   0 0 0 0 0 0 0 0  0 0     Review of Systems  Constitutional: Negative.   Musculoskeletal: Positive for joint swelling, gait problem and stiffness.  Skin: Negative.     Allergies  Codeine  Home Medications   Prior to Admission medications   Medication Sig Start Date End Date Taking? Authorizing Provider  Cholecalciferol (VITAMIN D3) 2000 UNITS capsule Take 2,000 Units by mouth daily.    Historical Provider, MD  diclofenac sodium (VOLTAREN) 1 % GEL Apply 4 g topically 4 (four) times daily. Please instruct in dosing. 12/03/14   Billy Fischer, MD  fluticasone (FLONASE) 50 MCG/ACT nasal spray Place 1 spray into both nostrils as needed for allergies or rhinitis.     Historical Provider, MD  MAGNESIUM PO Take 400 mg by mouth daily.     Historical Provider, MD  Multiple Vitamins-Minerals (MULTIVITAMIN PO) Take 1 tablet by mouth.     Historical Provider, MD  Omega-3 Fatty Acids (FISH OIL PO) Take 1,000 mg by mouth daily.     Historical Provider, MD  phentermine 37.5 MG capsule Take 37.5 mg by mouth daily.    Historical Provider, MD   BP 134/75 mmHg  Pulse 84  Temp(Src) 98.7  F (37.1 C) (Oral)  Resp 16  SpO2 98%  LMP 10/04/2013 Physical Exam  Constitutional: She is oriented to person, place, and time. She appears well-developed and well-nourished. No distress.  Musculoskeletal: She exhibits tenderness.       Right knee: She exhibits decreased range of motion and swelling. Tenderness found.       Legs: Neurological: She is alert and oriented to person, place, and time.  Skin: Skin is warm and dry.  Nursing note and vitals reviewed.   ED Course  Procedures (including critical care time) Labs Review Labs Reviewed - No data to display  Imaging Review No results found.   MDM   1. Knee pain, acute, right        Billy Fischer, MD 12/03/14 1626

## 2015-03-27 ENCOUNTER — Encounter: Payer: Self-pay | Admitting: Obstetrics and Gynecology

## 2015-03-28 NOTE — Telephone Encounter (Signed)
Mychart response sent to patient with phone numbers for PCP in [place]. Will close encounter.

## 2015-04-10 ENCOUNTER — Encounter: Payer: Self-pay | Admitting: Obstetrics and Gynecology

## 2015-04-10 ENCOUNTER — Ambulatory Visit (INDEPENDENT_AMBULATORY_CARE_PROVIDER_SITE_OTHER): Payer: 59 | Admitting: Obstetrics and Gynecology

## 2015-04-10 VITALS — BP 178/104 | HR 84 | Resp 16 | Ht 62.5 in | Wt 213.0 lb

## 2015-04-10 DIAGNOSIS — Z01419 Encounter for gynecological examination (general) (routine) without abnormal findings: Secondary | ICD-10-CM

## 2015-04-10 DIAGNOSIS — M7989 Other specified soft tissue disorders: Secondary | ICD-10-CM | POA: Diagnosis not present

## 2015-04-10 DIAGNOSIS — IMO0001 Reserved for inherently not codable concepts without codable children: Secondary | ICD-10-CM

## 2015-04-10 DIAGNOSIS — R030 Elevated blood-pressure reading, without diagnosis of hypertension: Secondary | ICD-10-CM | POA: Diagnosis not present

## 2015-04-10 NOTE — Progress Notes (Signed)
Patient ID: Angela Garcia, female   DOB: 10/05/1960, 54 y.o.   MRN: [id number] 54 y.o. G0P0000 MarriedCaucasianF here for annual exam. Pt is under a lot of strees. Her husband has recently been DX with colon cancer that has spread to his liver, lungs, abdominal wall. He also has a tumor on his brain they are unsure if it's cancer.  Her blood pressure has been elevated lately due to her stress, she is going to see her primary. The patient c/o fatigue, not sleeping well at night. Feels overwhelmed, not depressed.  Both her parents died of lung cancer, uncle with lung cancer. Mom had a "gyn cancer" in her 20's, had a hysterectomy/BSO, never had chemotherapy. Sister had cervical cancer.  Sexually active, not currently secondary secondary to husbands illness. Lubricant helps discomfort.  No vaginal bleeding in over a year. No migraines since her cycles have stopped. H/O migraines with auras. No vasomotor symptoms.   Patient's last menstrual period was 10/04/2013.          Sexually active: Yes.    The current method of family planning is post menopausal status.    Exercising: No.  The patient does not participate in regular exercise at present. Smoker:  no  Health Maintenance: Pap:  12-14-13 WNL  History of abnormal Pap:  no MMG:  06-28-14 WNL Colonoscopy:  01-21-14 WNL repeat in 10 yrs  BMD:   N/A TDaP:  01/2014  Screening Labs: PCP will do labs   reports that she has never smoked. She has never used smokeless tobacco. She reports that she drinks alcohol. She reports that she does not use illicit drugs.  Past Medical History  Diagnosis Date  . Fibroid   . PONV (postoperative nausea and vomiting)   . Headache(784.0)     history - last one 4 months ago    Past Surgical History  Procedure Laterality Date  . Cataract extraction  05/2011    Bilateral  . Cholecystectomy  2007  . Cyst excision Left 2002    Sebaceous removed from Breast - left benign  . Colonoscopy  01/21/14    Normal - Every 10  years  . Eye surgery    . Hysteroscopy N/A 05/13/2014    Procedure: HYSTEROSCOPY with truclear  ;  Surgeon: Azalia Bilis, MD;  Location: [facility];  Service: Gynecology;  Laterality: N/A;    Current Outpatient Prescriptions  Medication Sig Dispense Refill  . cetirizine (ZYRTEC) 10 MG tablet Take 10 mg by mouth daily.    . Cholecalciferol (VITAMIN D3) 2000 UNITS capsule Take 2,000 Units by mouth daily.    . diclofenac sodium (VOLTAREN) 1 % GEL Apply 4 g topically 4 (four) times daily. Please instruct in dosing. 1 Tube 1  . ibuprofen (ADVIL,MOTRIN) 400 MG tablet Take 400 mg by mouth every 6 (six) hours as needed.    Marland Kitchen MAGNESIUM PO Take 400 mg by mouth daily.     . Multiple Vitamins-Minerals (MULTIVITAMIN PO) Take 1 tablet by mouth.     Marland Kitchen oxymetazoline (AFRIN) 0.05 % nasal spray Place 1 spray into both nostrils 2 (two) times daily.    . SUMAtriptan (IMITREX) 50 MG tablet Take 50 mg by mouth every 2 (two) hours as needed for migraine. May repeat in 2 hours if headache persists or recurs.     No current facility-administered medications for this visit.    Family History  Problem Relation Age of Onset  . Cancer Mother     Ovarian/Cevical?  .  Lung cancer Mother   . Ovarian cancer Mother   . Lung cancer Father   . Heart attack Father 61  . Stroke Father     Late 5's  . Hypertension Father   . Lung cancer Maternal Uncle   . Heart attack Paternal Grandmother   . Congestive Heart Failure Paternal Grandmother   . Ovarian cancer Sister   . Heart disease Sister   . Cancer Sister     ROS:  Pertinent items are noted in HPI.  Otherwise, a comprehensive ROS was negative.  Exam:   BP 178/104 mmHg  Pulse 84  Resp 16  Ht 5' 2.5" (1.588 m)  Wt 213 lb (96.616 kg)  BMI 38.31 kg/m2  LMP 10/04/2013  Weight change: @WEIGHTCHANGE @ Height:   Height: 5' 2.5" (158.8 cm)  Ht Readings from Last 3 Encounters:  04/10/15 5' 2.5" (1.588 m)  05/30/14 5' 2.5" (1.588 m)  05/09/14 5' 2.5" (1.588 m)     General appearance: alert, cooperative and appears stated age Head: Normocephalic, without obvious abnormality, atraumatic Neck: no adenopathy, supple, symmetrical, trachea midline and thyroid normal to inspection and palpation Lungs: clear to auscultation bilaterally Breasts: normal appearance, no masses or tenderness Heart: regular rate and rhythm Abdomen: soft, non-tender; bowel sounds normal; no masses,  no organomegaly Extremities: 2+ pitting edema BLE, mild erythema around her left lower leg Skin: Skin color, texture, turgor normal. No rashes or lesions Lymph nodes: Cervical, supraclavicular, and axillary nodes normal. No abnormal inguinal nodes palpated Neurologic: Grossly normal   Pelvic: External genitalia:  no lesions              Urethra:  normal appearing urethra with no masses, tenderness or lesions              Bartholins and Skenes: normal                 Vagina: normal appearing vagina with mild atrophy, no lesions              Cervix: no lesions              Pap taken: No. Bimanual Exam:  Uterus:  normal size, contour, position, consistency, mobility, non-tender and anteverted              Adnexa: normal adnexa and no mass, fullness, tenderness               Rectovaginal: Confirms               Anus:  normal sphincter tone, no lesions  Chaperone was present for exam.  A:  Well Woman with normal exam  Hypertension/swelling  Under stress with her husbands recent cancer diagnosis  P:   No pap this year  Mammogram in the fall  Colonoscopy UTD  F/U with primary for BP and labs  (she needs to be seen soon)  Discussed exercise  Continue breast self exam  Continue calcium and vit D  We discussed counseling and antidepressants, she will call if she wants to try medication (discussed welbutrin to try and decrease her risk of weight gain)  She is using OTC sleep aids which are helping

## 2015-04-10 NOTE — Patient Instructions (Signed)
Please call and make an appointment with a primary MD today. You need to be seen for your blood pressure  EXERCISE AND DIET:  We recommended that you start or continue a regular exercise program for good health. Regular exercise means any activity that makes your heart beat faster and makes you sweat.  We recommend exercising at least 30 minutes per day at least 3 days a week, preferably 4 or 5.  We also recommend a diet low in fat and sugar.  Inactivity, poor dietary choices and obesity can cause diabetes, heart attack, stroke, and kidney damage, among others.    ALCOHOL AND SMOKING:  Women should limit their alcohol intake to no more than 7 drinks/beers/glasses of wine (combined, not each!) per week. Moderation of alcohol intake to this level decreases your risk of breast cancer and liver damage. And of course, no recreational drugs are part of a healthy lifestyle.  And absolutely no smoking or even second hand smoke. Most people know smoking can cause heart and lung diseases, but did you know it also contributes to weakening of your bones? Aging of your skin?  Yellowing of your teeth and nails?  CALCIUM AND VITAMIN D:  Adequate intake of calcium and Vitamin D are recommended.  The recommendations for exact amounts of these supplements seem to change often, but generally speaking 600 mg of calcium (either carbonate or citrate) and 800 units of Vitamin D per day seems prudent. Certain women may benefit from higher intake of Vitamin D.  If you are among these women, your doctor will have told you during your visit.    PAP SMEARS:  Pap smears, to check for cervical cancer or precancers,  have traditionally been done yearly, although recent scientific advances have shown that most women can have pap smears less often.  However, every woman still should have a physical exam from her gynecologist every year. It will include a breast check, inspection of the vulva and vagina to check for abnormal growths or skin  changes, a visual exam of the cervix, and then an exam to evaluate the size and shape of the uterus and ovaries.  And after 54 years of age, a rectal exam is indicated to check for rectal cancers. We will also provide age appropriate advice regarding health maintenance, like when you should have certain vaccines, screening for sexually transmitted diseases, bone density testing, colonoscopy, mammograms, etc.   MAMMOGRAMS:  All women over 68 years old should have a yearly mammogram. Many facilities now offer a "3D" mammogram, which may cost around $50 extra out of pocket. If possible,  we recommend you accept the option to have the 3D mammogram performed.  It both reduces the number of women who will be called back for extra views which then turn out to be normal, and it is better than the routine mammogram at detecting truly abnormal areas.    COLONOSCOPY:  Colonoscopy to screen for colon cancer is recommended for all women at age 56.  We know, you hate the idea of the prep.  We agree, BUT, having colon cancer and not knowing it is worse!!  Colon cancer so often starts as a polyp that can be seen and removed at colonscopy, which can quite literally save your life!  And if your first colonoscopy is normal and you have no family history of colon cancer, most women don't have to have it again for 10 years.  Once every ten years, you can do something that may  end up saving your life, right?  We will be happy to help you get it scheduled when you are ready.  Be sure to check your insurance coverage so you understand how much it will cost.  It may be covered as a preventative service at no cost, but you should check your particular policy.

## 2015-04-19 ENCOUNTER — Telehealth: Payer: Self-pay | Admitting: General Practice

## 2015-04-19 NOTE — Telephone Encounter (Signed)
Pt hus will be a new pt here and wife would like to est with dr Regis Bill. They are trying to have mds at same location. Can I sch?

## 2015-04-28 NOTE — Telephone Encounter (Signed)
Pt wife does not want to wait until next year to sch her hus and appt. I offerred to see dr hunter a phone note requesting earlier appt. Pt wife will callback if need

## 2015-05-22 ENCOUNTER — Other Ambulatory Visit: Payer: Self-pay

## 2015-05-22 DIAGNOSIS — Z1231 Encounter for screening mammogram for malignant neoplasm of breast: Secondary | ICD-10-CM

## 2015-05-30 ENCOUNTER — Encounter: Payer: Self-pay | Admitting: Obstetrics and Gynecology

## 2015-05-31 ENCOUNTER — Telehealth: Payer: Self-pay | Admitting: Obstetrics and Gynecology

## 2015-05-31 NOTE — Telephone Encounter (Signed)
Received notification of her influenza vaccination on 05/30/15 at [facility]. Will scan document.

## 2015-06-09 ENCOUNTER — Ambulatory Visit (INDEPENDENT_AMBULATORY_CARE_PROVIDER_SITE_OTHER): Payer: 59 | Admitting: Internal Medicine

## 2015-06-09 ENCOUNTER — Encounter: Payer: Self-pay | Admitting: Internal Medicine

## 2015-06-09 ENCOUNTER — Other Ambulatory Visit (INDEPENDENT_AMBULATORY_CARE_PROVIDER_SITE_OTHER): Payer: 59

## 2015-06-09 VITALS — BP 160/100 | HR 78 | Temp 98.0°F | Resp 16 | Ht 63.0 in | Wt 219.0 lb

## 2015-06-09 DIAGNOSIS — R7989 Other specified abnormal findings of blood chemistry: Secondary | ICD-10-CM

## 2015-06-09 DIAGNOSIS — Z0000 Encounter for general adult medical examination without abnormal findings: Secondary | ICD-10-CM

## 2015-06-09 DIAGNOSIS — R030 Elevated blood-pressure reading, without diagnosis of hypertension: Secondary | ICD-10-CM | POA: Insufficient documentation

## 2015-06-09 DIAGNOSIS — E669 Obesity, unspecified: Secondary | ICD-10-CM

## 2015-06-09 LAB — LIPID PANEL
Cholesterol: 235 mg/dL — ABNORMAL HIGH (ref 0–200)
HDL: 47 mg/dL (ref >39.00)
NonHDL: 188.1
Total CHOL/HDL Ratio: 5
Triglycerides: 236 mg/dL — ABNORMAL HIGH (ref 0.0–149.0)
VLDL: 47.2 mg/dL — ABNORMAL HIGH (ref 0.0–40.0)

## 2015-06-09 LAB — COMPREHENSIVE METABOLIC PANEL
ALT: 25 U/L (ref 0–35)
AST: 23 U/L (ref 0–37)
Albumin: 4.3 g/dL (ref 3.5–5.2)
Alkaline Phosphatase: 85 U/L (ref 39–117)
BUN: 11 mg/dL (ref 6–23)
CO2: 30 mEq/L (ref 19–32)
Calcium: 9.4 mg/dL (ref 8.4–10.5)
Chloride: 104 mEq/L (ref 96–112)
Creatinine, Ser: 0.62 mg/dL (ref 0.40–1.20)
GFR: 106.65 mL/min (ref >60.00)
Glucose, Bld: 105 mg/dL — ABNORMAL HIGH (ref 70–99)
Potassium: 4 mEq/L (ref 3.5–5.1)
Sodium: 141 mEq/L (ref 135–145)
Total Bilirubin: 0.5 mg/dL (ref 0.2–1.2)
Total Protein: 7.2 g/dL (ref 6.0–8.3)

## 2015-06-09 LAB — HEMOGLOBIN A1C: Hgb A1c MFr Bld: 6.2 % (ref 4.6–6.5)

## 2015-06-09 LAB — LDL CHOLESTEROL, DIRECT: Direct LDL: 143 mg/dL

## 2015-06-09 NOTE — Progress Notes (Signed)
   Subjective:    Patient ID: Angela Garcia, female    DOB: 09/08/1960, 54 y.o.   MRN: [id number]  HPI The patient is a new 54 YO female coming in for wellness. Dealing with husband's recent diagnosis of stage 4 colon cancer as primary caregiver.   PMH, [facility], social history reviewed and updated.   Review of Systems  Constitutional: Negative for fever, activity change, appetite change, fatigue and unexpected weight change.  HENT: Negative.   Eyes: Negative.   Respiratory: Negative for cough, chest tightness, shortness of breath and wheezing.   Cardiovascular: Negative for chest pain, palpitations and leg swelling.  Gastrointestinal: Negative for nausea, abdominal pain, diarrhea, constipation and abdominal distention.  Musculoskeletal: Positive for arthralgias. Negative for myalgias, joint swelling and gait problem.  Skin: Negative.   Neurological: Negative.   Psychiatric/Behavioral: Negative.       Objective:   Physical Exam  Constitutional: She is oriented to person, place, and time. She appears well-developed and well-nourished.  Overweight  HENT:  Head: Normocephalic and atraumatic.  Eyes: EOM are normal.  Neck: Normal range of motion.  Cardiovascular: Normal rate and regular rhythm.   Pulmonary/Chest: Effort normal and breath sounds normal. No respiratory distress. She has no wheezes. She has no rales.  Abdominal: Soft. Bowel sounds are normal. She exhibits no distension. There is no tenderness. There is no rebound.  Musculoskeletal: She exhibits no edema.  Neurological: She is alert and oriented to person, place, and time. Coordination normal.  Skin: Skin is warm and dry.  Psychiatric: She has a normal mood and affect.   Filed Vitals:   06/09/15 1308  BP: 160/100  Pulse: 78  Temp: 98 F (36.7 C)  TempSrc: Oral  Resp: 16  Height: 5\' 3"  (1.6 m)  Weight: 219 lb (99.338 kg)  SpO2: 98%      Assessment & Plan:

## 2015-06-09 NOTE — Assessment & Plan Note (Signed)
Checking labs today, colonoscopy and mammogram up to date. Counseled her about exercise and her diet to help with her weight.

## 2015-06-09 NOTE — Progress Notes (Signed)
Pre visit review using our clinic review tool, if applicable. No additional management support is needed unless otherwise documented below in the visit note. 

## 2015-06-09 NOTE — Assessment & Plan Note (Signed)
BP slightly elevated today. BP at work about 150/70-80s. Likely increased in the last year due to 20 pound weight gain. Talked to her about the need for weight loss. Checking labs today and have talked to her about weight loss as BP control and see back in 6 months. If still high then will start BP medicine.

## 2015-06-09 NOTE — Patient Instructions (Signed)
We will check the blood work today and call you with the results.   Work on getting more time for yourself to walk to keep your health strong.   Health Maintenance, Female Adopting a healthy lifestyle and getting preventive care can go a long way to promote health and wellness. Talk with your health care provider about what schedule of regular examinations is right for you. This is a good chance for you to check in with your provider about disease prevention and staying healthy. In between checkups, there are plenty of things you can do on your own. Experts have done a lot of research about which lifestyle changes and preventive measures are most likely to keep you healthy. Ask your health care provider for more information. WEIGHT AND DIET  Eat a healthy diet  Be sure to include plenty of vegetables, fruits, low-fat dairy products, and lean protein.  Do not eat a lot of foods high in solid fats, added sugars, or salt.  Get regular exercise. This is one of the most important things you can do for your health.  Most adults should exercise for at least 150 minutes each week. The exercise should increase your heart rate and make you sweat (moderate-intensity exercise).  Most adults should also do strengthening exercises at least twice a week. This is in addition to the moderate-intensity exercise.  Maintain a healthy weight  Body mass index (BMI) is a measurement that can be used to identify possible weight problems. It estimates body fat based on height and weight. Your health care provider can help determine your BMI and help you achieve or maintain a healthy weight.  For females 64 years of age and older:   A BMI below 18.5 is considered underweight.  A BMI of 18.5 to 24.9 is normal.  A BMI of 25 to 29.9 is considered overweight.  A BMI of 30 and above is considered obese.  Watch levels of cholesterol and blood lipids  You should start having your blood tested for lipids and  cholesterol at 54 years of age, then have this test every 5 years.  You may need to have your cholesterol levels checked more often if:  Your lipid or cholesterol levels are high.  You are older than 54 years of age.  You are at high risk for heart disease.  CANCER SCREENING   Lung Cancer  Lung cancer screening is recommended for adults 36-4 years old who are at high risk for lung cancer because of a history of smoking.  A yearly low-dose CT scan of the lungs is recommended for people who:  Currently smoke.  Have quit within the past 15 years.  Have at least a 30-pack-year history of smoking. A pack year is smoking an average of one pack of cigarettes a day for 1 year.  Yearly screening should continue until it has been 15 years since you quit.  Yearly screening should stop if you develop a health problem that would prevent you from having lung cancer treatment.  Breast Cancer  Practice breast self-awareness. This means understanding how your breasts normally appear and feel.  It also means doing regular breast self-exams. Let your health care provider know about any changes, no matter how small.  If you are in your 20s or 30s, you should have a clinical breast exam (CBE) by a health care provider every 1-3 years as part of a regular health exam.  If you are 70 or older, have a CBE every year.  Also consider having a breast X-ray (mammogram) every year.  If you have a family history of breast cancer, talk to your health care provider about genetic screening.  If you are at high risk for breast cancer, talk to your health care provider about having an MRI and a mammogram every year.  Breast cancer gene (BRCA) assessment is recommended for women who have family members with BRCA-related cancers. BRCA-related cancers include:  Breast.  Ovarian.  Tubal.  Peritoneal cancers.  Results of the assessment will determine the need for genetic counseling and BRCA1 and BRCA2  testing. Cervical Cancer Your health care provider may recommend that you be screened regularly for cancer of the pelvic organs (ovaries, uterus, and vagina). This screening involves a pelvic examination, including checking for microscopic changes to the surface of your cervix (Pap test). You may be encouraged to have this screening done every 3 years, beginning at age 21.  For women ages 30-65, health care providers may recommend pelvic exams and Pap testing every 3 years, or they may recommend the Pap and pelvic exam, combined with testing for human papilloma virus (HPV), every 5 years. Some types of HPV increase your risk of cervical cancer. Testing for HPV may also be done on women of any age with unclear Pap test results.  Other health care providers may not recommend any screening for nonpregnant women who are considered low risk for pelvic cancer and who do not have symptoms. Ask your health care provider if a screening pelvic exam is right for you.  If you have had past treatment for cervical cancer or a condition that could lead to cancer, you need Pap tests and screening for cancer for at least 20 years after your treatment. If Pap tests have been discontinued, your risk factors (such as having a new sexual partner) need to be reassessed to determine if screening should resume. Some women have medical problems that increase the chance of getting cervical cancer. In these cases, your health care provider may recommend more frequent screening and Pap tests. Colorectal Cancer  This type of cancer can be detected and often prevented.  Routine colorectal cancer screening usually begins at 54 years of age and continues through 54 years of age.  Your health care provider may recommend screening at an earlier age if you have risk factors for colon cancer.  Your health care provider may also recommend using home test kits to check for hidden blood in the stool.  A small camera at the end of a  tube can be used to examine your colon directly (sigmoidoscopy or colonoscopy). This is done to check for the earliest forms of colorectal cancer.  Routine screening usually begins at age 50.  Direct examination of the colon should be repeated every 5-10 years through 54 years of age. However, you may need to be screened more often if early forms of precancerous polyps or small growths are found. Skin Cancer  Check your skin from head to toe regularly.  Tell your health care provider about any new moles or changes in moles, especially if there is a change in a mole's shape or color.  Also tell your health care provider if you have a mole that is larger than the size of a pencil eraser.  Always use sunscreen. Apply sunscreen liberally and repeatedly throughout the day.  Protect yourself by wearing long sleeves, pants, a wide-brimmed hat, and sunglasses whenever you are outside. HEART DISEASE, DIABETES, AND HIGH BLOOD PRESSURE     High blood pressure causes heart disease and increases the risk of stroke. High blood pressure is more likely to develop in:  People who have blood pressure in the high end of the normal range (130-139/85-89 mm Hg).  People who are overweight or obese.  People who are African American.  If you are 18-39 years of age, have your blood pressure checked every 3-5 years. If you are 40 years of age or older, have your blood pressure checked every year. You should have your blood pressure measured twice--once when you are at a hospital or clinic, and once when you are not at a hospital or clinic. Record the average of the two measurements. To check your blood pressure when you are not at a hospital or clinic, you can use:  An automated blood pressure machine at a pharmacy.  A home blood pressure monitor.  If you are between 55 years and 79 years old, ask your health care provider if you should take aspirin to prevent strokes.  Have regular diabetes screenings. This  involves taking a blood sample to check your fasting blood sugar level.  If you are at a normal weight and have a low risk for diabetes, have this test once every three years after 54 years of age.  If you are overweight and have a high risk for diabetes, consider being tested at a younger age or more often. PREVENTING INFECTION  Hepatitis B  If you have a higher risk for hepatitis B, you should be screened for this virus. You are considered at high risk for hepatitis B if:  You were born in a country where hepatitis B is common. Ask your health care provider which countries are considered high risk.  Your parents were born in a high-risk country, and you have not been immunized against hepatitis B (hepatitis B vaccine).  You have HIV or AIDS.  You use needles to inject street drugs.  You live with someone who has hepatitis B.  You have had sex with someone who has hepatitis B.  You get hemodialysis treatment.  You take certain medicines for conditions, including cancer, organ transplantation, and autoimmune conditions. Hepatitis C  Blood testing is recommended for:  Everyone born from 1945 through 1965.  Anyone with known risk factors for hepatitis C. Sexually transmitted infections (STIs)  You should be screened for sexually transmitted infections (STIs) including gonorrhea and chlamydia if:  You are sexually active and are younger than 54 years of age.  You are older than 54 years of age and your health care provider tells you that you are at risk for this type of infection.  Your sexual activity has changed since you were last screened and you are at an increased risk for chlamydia or gonorrhea. Ask your health care provider if you are at risk.  If you do not have HIV, but are at risk, it may be recommended that you take a prescription medicine daily to prevent HIV infection. This is called pre-exposure prophylaxis (PrEP). You are considered at risk if:  You are  sexually active and do not regularly use condoms or know the HIV status of your partner(s).  You take drugs by injection.  You are sexually active with a partner who has HIV. Talk with your health care provider about whether you are at high risk of being infected with HIV. If you choose to begin PrEP, you should first be tested for HIV. You should then be tested every 3 months for as   long as you are taking PrEP.  PREGNANCY   If you are premenopausal and you may become pregnant, ask your health care provider about preconception counseling.  If you may become pregnant, take 400 to 800 micrograms (mcg) of folic acid every day.  If you want to prevent pregnancy, talk to your health care provider about birth control (contraception). OSTEOPOROSIS AND MENOPAUSE   Osteoporosis is a disease in which the bones lose minerals and strength with aging. This can result in serious bone fractures. Your risk for osteoporosis can be identified using a bone density scan.  If you are 27 years of age or older, or if you are at risk for osteoporosis and fractures, ask your health care provider if you should be screened.  Ask your health care provider whether you should take a calcium or vitamin D supplement to lower your risk for osteoporosis.  Menopause may have certain physical symptoms and risks.  Hormone replacement therapy may reduce some of these symptoms and risks. Talk to your health care provider about whether hormone replacement therapy is right for you.  HOME CARE INSTRUCTIONS   Schedule regular health, dental, and eye exams.  Stay current with your immunizations.   Do not use any tobacco products including cigarettes, chewing tobacco, or electronic cigarettes.  If you are pregnant, do not drink alcohol.  If you are breastfeeding, limit how much and how often you drink alcohol.  Limit alcohol intake to no more than 1 drink per day for nonpregnant women. One drink equals 12 ounces of beer, 5  ounces of wine, or 1 ounces of hard liquor.  Do not use street drugs.  Do not share needles.  Ask your health care provider for help if you need support or information about quitting drugs.  Tell your health care provider if you often feel depressed.  Tell your health care provider if you have ever been abused or do not feel safe at home.   This information is not intended to replace advice given to you by your health care provider. Make sure you discuss any questions you have with your health care provider.   Document Released: 02/18/2011 Document Revised: 08/26/2014 Document Reviewed: 07/07/2013 Elsevier Interactive Patient Education [employer].

## 2015-06-09 NOTE — Assessment & Plan Note (Signed)
Talked to her about the consequences of her weight and she knows and will work hard to get rid of some of her extra weight.

## 2015-06-29 ENCOUNTER — Ambulatory Visit
Admission: RE | Admit: 2015-06-29 | Discharge: 2015-06-29 | Disposition: A | Discharge status: Home / Self Care | Payer: 59 | Source: Ambulatory Visit

## 2015-06-29 DIAGNOSIS — Z1231 Encounter for screening mammogram for malignant neoplasm of breast: Secondary | ICD-10-CM

## 2015-08-16 ENCOUNTER — Encounter: Payer: Self-pay | Admitting: Internal Medicine

## 2015-08-17 MED ORDER — AMLODIPINE BESYLATE 5 MG PO TABS: 5.0000 mg | ORAL_TABLET | Freq: Every day | ORAL | Status: DC

## 2015-08-17 MED ORDER — METFORMIN HCL 500 MG PO TABS: 500.0000 mg | ORAL_TABLET | Freq: Every day | ORAL | Status: DC

## 2015-09-01 DIAGNOSIS — M545 Low back pain: Secondary | ICD-10-CM | POA: Diagnosis not present

## 2015-09-01 DIAGNOSIS — M25561 Pain in right knee: Secondary | ICD-10-CM | POA: Diagnosis not present

## 2015-09-05 ENCOUNTER — Other Ambulatory Visit (HOSPITAL_COMMUNITY): Payer: Self-pay | Admitting: Orthopedic Surgery

## 2015-09-05 DIAGNOSIS — M79604 Pain in right leg: Secondary | ICD-10-CM

## 2015-09-05 DIAGNOSIS — M79605 Pain in left leg: Secondary | ICD-10-CM

## 2015-09-05 DIAGNOSIS — M25561 Pain in right knee: Secondary | ICD-10-CM

## 2015-09-19 ENCOUNTER — Ambulatory Visit (HOSPITAL_COMMUNITY)
Admission: RE | Admit: 2015-09-19 | Discharge: 2015-09-19 | Disposition: A | Discharge status: Home / Self Care | Payer: 59 | Source: Ambulatory Visit | Attending: Orthopedic Surgery | Admitting: Orthopedic Surgery

## 2015-09-19 DIAGNOSIS — M79605 Pain in left leg: Secondary | ICD-10-CM

## 2015-09-19 DIAGNOSIS — S83241A Other tear of medial meniscus, current injury, right knee, initial encounter: Secondary | ICD-10-CM | POA: Insufficient documentation

## 2015-09-19 DIAGNOSIS — M545 Low back pain: Secondary | ICD-10-CM | POA: Insufficient documentation

## 2015-09-19 DIAGNOSIS — X58XXXA Exposure to other specified factors, initial encounter: Secondary | ICD-10-CM | POA: Insufficient documentation

## 2015-09-19 DIAGNOSIS — M47816 Spondylosis without myelopathy or radiculopathy, lumbar region: Secondary | ICD-10-CM | POA: Insufficient documentation

## 2015-09-19 DIAGNOSIS — M25561 Pain in right knee: Secondary | ICD-10-CM | POA: Insufficient documentation

## 2015-09-19 DIAGNOSIS — M25461 Effusion, right knee: Secondary | ICD-10-CM | POA: Insufficient documentation

## 2015-09-19 DIAGNOSIS — M5136 Other intervertebral disc degeneration, lumbar region: Secondary | ICD-10-CM | POA: Diagnosis not present

## 2015-09-19 DIAGNOSIS — M1711 Unilateral primary osteoarthritis, right knee: Secondary | ICD-10-CM | POA: Diagnosis not present

## 2015-09-19 DIAGNOSIS — M4806 Spinal stenosis, lumbar region: Secondary | ICD-10-CM | POA: Diagnosis not present

## 2015-09-19 DIAGNOSIS — M79604 Pain in right leg: Secondary | ICD-10-CM

## 2015-09-25 DIAGNOSIS — M25561 Pain in right knee: Secondary | ICD-10-CM | POA: Diagnosis not present

## 2015-09-25 DIAGNOSIS — M545 Low back pain: Secondary | ICD-10-CM | POA: Diagnosis not present

## 2015-09-27 ENCOUNTER — Encounter: Payer: Self-pay | Admitting: Nurse Practitioner

## 2015-09-27 ENCOUNTER — Ambulatory Visit: Payer: 59 | Admitting: Nurse Practitioner

## 2015-09-27 ENCOUNTER — Ambulatory Visit (INDEPENDENT_AMBULATORY_CARE_PROVIDER_SITE_OTHER): Payer: 59 | Admitting: Nurse Practitioner

## 2015-09-27 VITALS — BP 138/78 | HR 80 | Temp 98.0°F | Ht 63.0 in | Wt 231.5 lb

## 2015-09-27 DIAGNOSIS — J019 Acute sinusitis, unspecified: Secondary | ICD-10-CM

## 2015-09-27 MED ORDER — AMOXICILLIN-POT CLAVULANATE 875-125 MG PO TABS: 1.0000 | ORAL_TABLET | Freq: Two times a day (BID) | ORAL | Status: DC

## 2015-09-27 NOTE — Progress Notes (Signed)
Patient ID: Angela Garcia, female    DOB: [birth year]  Age: 55 y.o. MRN: [id number]  CC: No chief complaint on file.   HPI Angela Garcia presents for CC of sinus pressure and cough x 2 days.   1) Productive cough, sinus pressure, teeth pain Sneezing and rhinorrhea Cough- green/yellow  Ear pressure bilaterally   Treatment to date: DayQuil- not helpful  Norel AD- Not helpful Sudafed Sinus- helpful   Sick contacts: Husband   History Angela Garcia has a past medical history of Fibroid; PONV (postoperative nausea and vomiting); and Headache(784.0).   She has past surgical history that includes Cataract extraction (05/2011); Cholecystectomy (2007); Cyst excision (Left, 2002); Colonoscopy (01/21/14); Eye surgery; and Hysteroscopy (N/A, 05/13/2014).   Her family history includes Cancer in her mother and sister; Congestive Heart Failure in her paternal grandmother; Heart attack in her paternal grandmother; Heart attack (age of onset: 28) in her father; Heart disease in her sister; Hypertension in her father; Lung cancer in her father, maternal uncle, and mother; Ovarian cancer in her mother and sister; Stroke in her father.She reports that she has never smoked. She has never used smokeless tobacco. She reports that she drinks alcohol. She reports that she does not use illicit drugs.  Outpatient Prescriptions Prior to Visit  Medication Sig Dispense Refill  . amLODipine (NORVASC) 5 MG tablet Take 1 tablet (5 mg total) by mouth daily. 90 tablet 3  . Cholecalciferol (VITAMIN D3) 2000 UNITS capsule Take 2,000 Units by mouth daily.    Marland Kitchen ibuprofen (ADVIL,MOTRIN) 400 MG tablet Take 400 mg by mouth every 6 (six) hours as needed.    Marland Kitchen MAGNESIUM PO Take 400 mg by mouth daily.     . metFORMIN (GLUCOPHAGE) 500 MG tablet Take 1 tablet (500 mg total) by mouth daily with breakfast. 90 tablet 3  . Multiple Vitamins-Minerals (MULTIVITAMIN PO) Take 1 tablet by mouth.     . diclofenac sodium (VOLTAREN) 1 % GEL Apply 4  g topically 4 (four) times daily. Please instruct in dosing. 1 Tube 1  . cetirizine (ZYRTEC) 10 MG tablet Take 10 mg by mouth daily. Reported on 09/27/2015    . oxymetazoline (AFRIN) 0.05 % nasal spray Place 1 spray into both nostrils 2 (two) times daily. Reported on 09/27/2015    . SUMAtriptan (IMITREX) 50 MG tablet Take 50 mg by mouth every 2 (two) hours as needed for migraine. Reported on 09/27/2015     No facility-administered medications prior to visit.    ROS Review of Systems  Constitutional: Negative for fever, chills, diaphoresis and fatigue.  HENT: Positive for congestion, ear pain, postnasal drip, rhinorrhea, sinus pressure and sneezing. Negative for sore throat and trouble swallowing.   Respiratory: Positive for cough. Negative for chest tightness, shortness of breath and wheezing.   Gastrointestinal: Negative for nausea, vomiting and diarrhea.  Skin: Negative for rash.  Neurological: Negative for dizziness, numbness and headaches.   Objective:  BP 138/78 mmHg  Pulse 80  Temp(Src) 98 F (36.7 C) (Oral)  Ht 5\' 3"  (1.6 m)  Wt 231 lb 8 oz (105.008 kg)  BMI 41.02 kg/m2  SpO2 96%  LMP 10/04/2013   Physical Exam  Constitutional: She is oriented to person, place, and time. She appears well-developed and well-nourished. No distress.  HENT:  Head: Normocephalic and atraumatic.  Right Ear: External ear normal.  Left Ear: External ear normal.  Cardiovascular: Normal rate, regular rhythm and normal heart sounds.  Exam reveals no gallop and no friction rub.  No murmur heard. Pulmonary/Chest: Effort normal and breath sounds normal. No respiratory distress. She has no wheezes. She has no rales. She exhibits no tenderness.  Neurological: She is alert and oriented to person, place, and time.  Skin: Skin is warm and dry. No rash noted. She is not diaphoretic.  Psychiatric: She has a normal mood and affect. Her behavior is normal. Judgment and thought content normal.   Assessment & Plan:    Diagnoses and all orders for this visit:  Acute sinusitis, recurrence not specified, unspecified location  Other orders -     amoxicillin-clavulanate (AUGMENTIN) 875-125 MG tablet; Take 1 tablet by mouth 2 (two) times daily.  I have discontinued Ms. Grimmett's diclofenac sodium. I am also having her start on amoxicillin-clavulanate. Additionally, I am having her maintain her MAGNESIUM PO, Multiple Vitamins-Minerals (MULTIVITAMIN PO), Vitamin D3, cetirizine, ibuprofen, SUMAtriptan, oxymetazoline, metFORMIN, and amLODipine.  Meds ordered this encounter  Medications  . amoxicillin-clavulanate (AUGMENTIN) 875-125 MG tablet    Sig: Take 1 tablet by mouth 2 (two) times daily.    Dispense:  14 tablet    Refill:  0    Order Specific Question:  Supervising Provider    Answer:  Crecencio Mc [[id number]]     Follow-up: Return if symptoms worsen or fail to improve.

## 2015-09-27 NOTE — Patient Instructions (Signed)
Adenovirus Adenoviruses are common viruses that cause many different types of infections. The common cold (upper respiratory infection) is the most common type of infection from an adenovirus. Adenoviruses can also infect your digestive system, eyes, lungs, and bladder. An adenovirus spreads easily from person to person. This is especially true if you are in close contact with someone who is sick. You can breathe in the virus after a sick person coughs or sneezes. Adenoviruses can live outside the body for many weeks. Therefore, you can also get sick after touching an object the virus is living on and then touching your nose or mouth. Adenovirus infections are usually not serious unless you have another health problem that makes it hard for you to fight off the infection.  CAUSES  There are more than 50 types of adenoviruses that can cause infections in humans. Different types of adenoviruses cause different types of infection.  RISK FACTORS The risk for an adenovirus infection is higher for:  People who spend a lot of time in places where there are lots of other people. This includes schools, summer camps, and day care centers.  Babies.  Elderly people.  People with a weak body defense system (immune system).  People with heart or lung disease. SIGNS AND SYMPTOMS  It can take 2-14 days to develop symptoms after the virus gets into your body. Symptoms may include:  Fever.  Sore throat.  Ear pain or fullness.  Nasal congestion.  Cough.  Difficulty breathing.  Stomachache.  Diarrhea.  Pain while passing urine.  Blood in the urine.  Pinkeye (conjunctivitis). DIAGNOSIS  Your health care provider may diagnose an adenovirus infection from your signs and symptoms. A physical exam will also be done. You may have tests to make sure your symptoms are not caused by another type of problem. These can include a blood test, throat culture, or chest X-ray. TREATMENT  There is no  treatment for an adenovirus infection. These infections usually clear up on their own with home care. Your health care provider may recommend over-the-counter medicine to help relieve a sore throat, fever, or headache. HOME CARE INSTRUCTIONS  Rest at home until your symptoms go away.  Drink enough fluid to keep your urine clear or pale yellow.  Take medicines only as directed by your health care provider. PREVENTION   Wash your hands often with soap and water.  Avoid close contact with people who are sick.  Do not go to school or work when you are sick.  Cover your nose or mouth when you sneeze or cough. SEEK MEDICAL CARE IF: Your symptoms of adenovirus infection do not clear up or are getting worse after several days. SEEK IMMEDIATE MEDICAL CARE IF: You have trouble breathing. MAKE SURE YOU:  Understand these instructions.  Will watch your condition.  Will get help right away if you are not doing well or get worse.   This information is not intended to replace advice given to you by your health care provider. Make sure you discuss any questions you have with your health care provider.   Document Released: 10/26/2002 Document Revised: 08/26/2014 Document Reviewed: 12/08/2013 Elsevier Interactive Patient Education [employer].

## 2015-09-30 DIAGNOSIS — J019 Acute sinusitis, unspecified: Secondary | ICD-10-CM | POA: Insufficient documentation

## 2015-09-30 NOTE — Assessment & Plan Note (Signed)
New onset Will treat conservatively due to probable viral nature Flonase OTC advised Mucinex plain and benadryl at night encouraged  Antibiotic prescription printed/sent to pharmacy in case of no improvement or fever within 48 hours. Pt verbalized understanding of need to hold.  FU prn worsening/failure to improve.

## 2016-01-05 DIAGNOSIS — H43813 Vitreous degeneration, bilateral: Secondary | ICD-10-CM | POA: Diagnosis not present

## 2016-01-05 DIAGNOSIS — Z961 Presence of intraocular lens: Secondary | ICD-10-CM | POA: Diagnosis not present

## 2016-01-05 DIAGNOSIS — H52223 Regular astigmatism, bilateral: Secondary | ICD-10-CM | POA: Diagnosis not present

## 2016-01-05 DIAGNOSIS — H524 Presbyopia: Secondary | ICD-10-CM | POA: Diagnosis not present

## 2016-01-05 DIAGNOSIS — H5203 Hypermetropia, bilateral: Secondary | ICD-10-CM | POA: Diagnosis not present

## 2016-04-29 ENCOUNTER — Encounter: Payer: Self-pay | Admitting: Obstetrics and Gynecology

## 2016-04-29 ENCOUNTER — Ambulatory Visit (INDEPENDENT_AMBULATORY_CARE_PROVIDER_SITE_OTHER): Payer: 59 | Admitting: Obstetrics and Gynecology

## 2016-04-29 VITALS — BP 130/84 | HR 92 | Resp 18 | Ht 62.0 in | Wt 204.6 lb

## 2016-04-29 DIAGNOSIS — Z01419 Encounter for gynecological examination (general) (routine) without abnormal findings: Secondary | ICD-10-CM | POA: Diagnosis not present

## 2016-04-29 DIAGNOSIS — I10 Essential (primary) hypertension: Secondary | ICD-10-CM | POA: Insufficient documentation

## 2016-04-29 NOTE — Patient Instructions (Signed)

## 2016-04-29 NOTE — Progress Notes (Signed)
55 y.o. G0P0000 MarriedCaucasianF here for annual exam.   Husband with end stage colon cancer. He is 55 years old, has distant mets. Took a chemo holiday, not working. Restarted aggressive chemo 2 weeks ago. Feels terrible with the chemo. She is hoping to have him for another Christmas.  She is sad and teary. Doesn't think she is depressed. Sleep okay, able to find joy. She and her husband are able to keep laughing. Together x 17 years (dated previously in the 75's). They have great friends and support.  Sexually active sometimes, uses lubricant, no pain.  No vaginal bleeding.     Patient's last menstrual period was 10/04/2013.          Sexually active: Yes.    The current method of family planning is post menopausal status.    Exercising: No.  The patient does not participate in regular exercise at present. Smoker:  no  Health Maintenance: Pap:  12/14/13-WNL  History of abnormal Pap:  no MMG:  06/29/15 BIRADS1 Colonoscopy: 01/21/14 Normal  BMD:   no TDaP:  05/30/2015    reports that she has never smoked. She has never used smokeless tobacco. She reports that she drinks about 0.6 - 1.2 oz of alcohol per week . She reports that she does not use drugs. She is working at an [employer] firm.   Past Medical History:  Diagnosis Date  . Fibroid   . Headache(784.0)    history - last one 4 months ago  . Hypertension   . PONV (postoperative nausea and vomiting)     Past Surgical History:  Procedure Laterality Date  . CATARACT EXTRACTION  05/2011   Bilateral  . CHOLECYSTECTOMY  2007  . COLONOSCOPY  01/21/14   Normal - Every 10 years  . CYST EXCISION Left 2002   Sebaceous removed from Breast - left benign  . EYE SURGERY    . HYSTEROSCOPY N/A 05/13/2014   Procedure: HYSTEROSCOPY with truclear  ;  Surgeon: Azalia Bilis, MD;  Location: [facility];  Service: Gynecology;  Laterality: N/A;    Current Outpatient Prescriptions  Medication Sig Dispense Refill  . amLODipine (NORVASC) 5 MG tablet  Take 1 tablet (5 mg total) by mouth daily. 90 tablet 3  . cetirizine (ZYRTEC) 10 MG tablet Take 10 mg by mouth daily. Reported on 09/27/2015    . Cholecalciferol (VITAMIN D3) 2000 UNITS capsule Take 2,000 Units by mouth daily.    Marland Kitchen ibuprofen (ADVIL,MOTRIN) 400 MG tablet Take 400 mg by mouth every 6 (six) hours as needed.    . magnesium gluconate (MAGONATE) 30 MG tablet Take 30 mg by mouth 2 (two) times daily.    Marland Kitchen MAGNESIUM PO Take 400 mg by mouth daily.     . metFORMIN (GLUCOPHAGE) 500 MG tablet Take 1 tablet (500 mg total) by mouth daily with breakfast. 90 tablet 3  . Multiple Vitamins-Minerals (MULTIVITAMIN PO) Take 1 tablet by mouth.      No current facility-administered medications for this visit.     Family History  Problem Relation Age of Onset  . Cancer Mother     Ovarian/Cevical?  . Lung cancer Mother   . Ovarian cancer Mother   . Lung cancer Father   . Heart attack Father 51  . Stroke Father     Late 2's  . Hypertension Father   . Ovarian cancer Sister   . Heart disease Sister   . Cancer Sister   . Lung cancer Maternal Uncle   .  Heart attack Paternal Grandmother   . Congestive Heart Failure Paternal Grandmother     Review of Systems  Constitutional: Negative.   HENT: Negative.   Eyes: Negative.   Respiratory: Negative.   Cardiovascular: Negative.   Gastrointestinal: Negative.   Endocrine: Negative.   Genitourinary: Negative.   Musculoskeletal: Negative.   Skin: Negative.   Allergic/Immunologic: Negative.   Neurological: Negative.   Hematological: Negative.   Psychiatric/Behavioral: Negative.     Exam:   BP 130/84 (BP Location: Right Arm, Patient Position: Sitting, Cuff Size: Large)   Pulse 92   Resp 18   Ht 5\' 2"  (1.575 m)   Wt 204 lb 9.6 oz (92.8 kg)   LMP 10/04/2013   BMI 37.42 kg/m   Weight change: @WEIGHTCHANGE @ Height:   Height: 5\' 2"  (157.5 cm)  Ht Readings from Last 3 Encounters:  04/29/16 5\' 2"  (1.575 m)  09/27/15 5\' 3"  (1.6 m)  06/09/15 5'  3" (1.6 m)    General appearance: alert, cooperative and appears stated age Head: Normocephalic, without obvious abnormality, atraumatic Neck: no adenopathy, supple, symmetrical, trachea midline and thyroid normal to inspection and palpation Lungs: clear to auscultation bilaterally Breasts: normal appearance, no masses or tenderness Heart: regular rate and rhythm Abdomen: soft, non-tender; bowel sounds normal; no masses,  no organomegaly Extremities: extremities normal, atraumatic, no cyanosis or edema Skin: Skin color, texture, turgor normal. No rashes or lesions Lymph nodes: Cervical, supraclavicular, and axillary nodes normal. No abnormal inguinal nodes palpated Neurologic: Grossly normal   Pelvic: External genitalia:  no lesions              Urethra:  normal appearing urethra with no masses, tenderness or lesions              Bartholins and Skenes: normal                 Vagina: normal appearing vagina with normal color and discharge, no lesions              Cervix: no lesions               Bimanual Exam:  Uterus:  normal size, contour, position, consistency, mobility, non-tender              Adnexa: no mass, fullness, tenderness               Rectovaginal: Confirms               Anus:  normal sphincter tone, no lesions  Chaperone was present for exam.  A:  Well Woman with normal exam  P:   No pap this year  Mammogram in 11/17  Colonoscopy UTD  Discussed breast self exam  Discussed calcium and vit D intake

## 2016-05-16 ENCOUNTER — Other Ambulatory Visit: Payer: Self-pay | Admitting: Internal Medicine

## 2016-05-16 DIAGNOSIS — Z1231 Encounter for screening mammogram for malignant neoplasm of breast: Secondary | ICD-10-CM

## 2016-07-04 ENCOUNTER — Ambulatory Visit (INDEPENDENT_AMBULATORY_CARE_PROVIDER_SITE_OTHER): Payer: 59 | Admitting: Internal Medicine

## 2016-07-04 ENCOUNTER — Encounter: Payer: Self-pay | Admitting: Internal Medicine

## 2016-07-04 ENCOUNTER — Other Ambulatory Visit (INDEPENDENT_AMBULATORY_CARE_PROVIDER_SITE_OTHER): Payer: 59

## 2016-07-04 ENCOUNTER — Ambulatory Visit
Admission: RE | Admit: 2016-07-04 | Discharge: 2016-07-04 | Disposition: A | Discharge status: Home / Self Care | Payer: 59 | Source: Ambulatory Visit | Attending: Internal Medicine | Admitting: Internal Medicine

## 2016-07-04 VITALS — BP 130/80 | HR 90 | Temp 98.4°F | Resp 16 | Ht 63.0 in | Wt 204.0 lb

## 2016-07-04 DIAGNOSIS — I10 Essential (primary) hypertension: Secondary | ICD-10-CM | POA: Diagnosis not present

## 2016-07-04 DIAGNOSIS — D229 Melanocytic nevi, unspecified: Secondary | ICD-10-CM | POA: Diagnosis not present

## 2016-07-04 DIAGNOSIS — Z23 Encounter for immunization: Secondary | ICD-10-CM

## 2016-07-04 DIAGNOSIS — Z0000 Encounter for general adult medical examination without abnormal findings: Secondary | ICD-10-CM

## 2016-07-04 DIAGNOSIS — Z1231 Encounter for screening mammogram for malignant neoplasm of breast: Secondary | ICD-10-CM | POA: Diagnosis not present

## 2016-07-04 LAB — COMPREHENSIVE METABOLIC PANEL
ALT: 30 U/L (ref 0–35)
AST: 23 U/L (ref 0–37)
Albumin: 4.5 g/dL (ref 3.5–5.2)
Alkaline Phosphatase: 112 U/L (ref 39–117)
BUN: 12 mg/dL (ref 6–23)
CO2: 28 mEq/L (ref 19–32)
Calcium: 9.3 mg/dL (ref 8.4–10.5)
Chloride: 103 mEq/L (ref 96–112)
Creatinine, Ser: 0.64 mg/dL (ref 0.40–1.20)
GFR: 102.4 mL/min (ref >60.00)
Glucose, Bld: 111 mg/dL — ABNORMAL HIGH (ref 70–99)
Potassium: 3.7 mEq/L (ref 3.5–5.1)
Sodium: 139 mEq/L (ref 135–145)
Total Bilirubin: 0.4 mg/dL (ref 0.2–1.2)
Total Protein: 7.7 g/dL (ref 6.0–8.3)

## 2016-07-04 LAB — CBC
HCT: 41.6 % (ref 36.0–46.0)
Hemoglobin: 14 g/dL (ref 12.0–15.0)
MCHC: 33.6 g/dL (ref 30.0–36.0)
MCV: 86.5 fl (ref 78.0–100.0)
Platelets: 497 10*3/uL — ABNORMAL HIGH (ref 150.0–400.0)
RBC: 4.81 Mil/uL (ref 3.87–5.11)
RDW: 12.4 % (ref 11.5–15.5)
WBC: 8.6 10*3/uL (ref 4.0–10.5)

## 2016-07-04 LAB — LIPID PANEL
Cholesterol: 222 mg/dL — ABNORMAL HIGH (ref 0–200)
HDL: 61 mg/dL (ref >39.00)
LDL Cholesterol: 142 mg/dL — ABNORMAL HIGH (ref 0–99)
NonHDL: 161.22
Total CHOL/HDL Ratio: 4
Triglycerides: 95 mg/dL (ref 0.0–149.0)
VLDL: 19 mg/dL (ref 0.0–40.0)

## 2016-07-04 LAB — HEMOGLOBIN A1C: Hgb A1c MFr Bld: 5.9 % (ref 4.6–6.5)

## 2016-07-04 MED ORDER — METFORMIN HCL 500 MG PO TABS: 500.0000 mg | ORAL_TABLET | Freq: Every day | ORAL | 3 refills | Status: DC

## 2016-07-04 MED ORDER — AMLODIPINE BESYLATE 10 MG PO TABS: 10.0000 mg | ORAL_TABLET | Freq: Every day | ORAL | 3 refills | Status: DC

## 2016-07-04 NOTE — Assessment & Plan Note (Signed)
Checking labs, colonoscopy up to date. Mammogram up to date. Flu shot given at visit, tetanus shot up to date. Counseled on sun safety and mole surveillance as well as the dangers of distracted driving. Given screening recommendations.

## 2016-07-04 NOTE — Patient Instructions (Signed)
We have given you the flu shot and will check the labs as well.   We will get you in with the dermatologist for the skin check.   Health Maintenance, Female Introduction Adopting a healthy lifestyle and getting preventive care can go a long way to promote health and wellness. Talk with your health care provider about what schedule of regular examinations is right for you. This is a good chance for you to check in with your provider about disease prevention and staying healthy. In between checkups, there are plenty of things you can do on your own. Experts have done a lot of research about which lifestyle changes and preventive measures are most likely to keep you healthy. Ask your health care provider for more information. Weight and diet Eat a healthy diet  Be sure to include plenty of vegetables, fruits, low-fat dairy products, and lean protein.  Do not eat a lot of foods high in solid fats, added sugars, or salt.  Get regular exercise. This is one of the most important things you can do for your health.  Most adults should exercise for at least 150 minutes each week. The exercise should increase your heart rate and make you sweat (moderate-intensity exercise).  Most adults should also do strengthening exercises at least twice a week. This is in addition to the moderate-intensity exercise. Maintain a healthy weight  Body mass index (BMI) is a measurement that can be used to identify possible weight problems. It estimates body fat based on height and weight. Your health care provider can help determine your BMI and help you achieve or maintain a healthy weight.  For females 55 years of age and older:  A BMI below 18.5 is considered underweight.  A BMI of 18.5 to 24.9 is normal.  A BMI of 25 to 29.9 is considered overweight.  A BMI of 30 and above is considered obese. Watch levels of cholesterol and blood lipids  You should start having your blood tested for lipids and cholesterol  at 55 years of age, then have this test every 5 years.  You may need to have your cholesterol levels checked more often if:  Your lipid or cholesterol levels are high.  You are older than 55 years of age.  You are at high risk for heart disease. Cancer screening Lung Cancer  Lung cancer screening is recommended for adults 55-20 years old who are at high risk for lung cancer because of a history of smoking.  A yearly low-dose CT scan of the lungs is recommended for people who:  Currently smoke.  Have quit within the past 15 years.  Have at least a 30-pack-year history of smoking. A pack year is smoking an average of one pack of cigarettes a day for 1 year.  Yearly screening should continue until it has been 15 years since you quit.  Yearly screening should stop if you develop a health problem that would prevent you from having lung cancer treatment. Breast Cancer  Practice breast self-awareness. This means understanding how your breasts normally appear and feel.  It also means doing regular breast self-exams. Let your health care provider know about any changes, no matter how small.  If you are in your 55s or 30s, you should have a clinical breast exam (CBE) by a health care provider every 1-3 years as part of a regular health exam.  If you are 55 or older, have a CBE every year. Also consider having a breast X-ray (mammogram) every  year.  If you have a family history of breast cancer, talk to your health care provider about genetic screening.  If you are at high risk for breast cancer, talk to your health care provider about having an MRI and a mammogram every year.  Breast cancer gene (BRCA) assessment is recommended for women who have family members with BRCA-related cancers. BRCA-related cancers include:  Breast.  Ovarian.  Tubal.  Peritoneal cancers.  Results of the assessment will determine the need for genetic counseling and BRCA1 and BRCA2 testing. Cervical  Cancer  Your health care provider may recommend that you be screened regularly for cancer of the pelvic organs (ovaries, uterus, and vagina). This screening involves a pelvic examination, including checking for microscopic changes to the surface of your cervix (Pap test). You may be encouraged to have this screening done every 3 years, beginning at age 55.  For women ages 55-65, health care providers may recommend pelvic exams and Pap testing every 3 years, or they may recommend the Pap and pelvic exam, combined with testing for human papilloma virus (HPV), every 5 years. Some types of HPV increase your risk of cervical cancer. Testing for HPV may also be done on women of any age with unclear Pap test results.  Other health care providers may not recommend any screening for nonpregnant women who are considered low risk for pelvic cancer and who do not have symptoms. Ask your health care provider if a screening pelvic exam is right for you.  If you have had past treatment for cervical cancer or a condition that could lead to cancer, you need Pap tests and screening for cancer for at least 20 years after your treatment. If Pap tests have been discontinued, your risk factors (such as having a new sexual partner) need to be reassessed to determine if screening should resume. Some women have medical problems that increase the chance of getting cervical cancer. In these cases, your health care provider may recommend more frequent screening and Pap tests. Colorectal Cancer  This type of cancer can be detected and often prevented.  Routine colorectal cancer screening usually begins at 55 years of age and continues through 55 years of age.  Your health care provider may recommend screening at an earlier age if you have risk factors for colon cancer.  Your health care provider may also recommend using home test kits to check for hidden blood in the stool.  A small camera at the end of a tube can be used to  examine your colon directly (sigmoidoscopy or colonoscopy). This is done to check for the earliest forms of colorectal cancer.  Routine screening usually begins at age 21.  Direct examination of the colon should be repeated every 5-10 years through 55 years of age. However, you may need to be screened more often if early forms of precancerous polyps or small growths are found. Skin Cancer  Check your skin from head to toe regularly.  Tell your health care provider about any new moles or changes in moles, especially if there is a change in a mole's shape or color.  Also tell your health care provider if you have a mole that is larger than the size of a pencil eraser.  Always use sunscreen. Apply sunscreen liberally and repeatedly throughout the day.  Protect yourself by wearing long sleeves, pants, a wide-brimmed hat, and sunglasses whenever you are outside. Heart disease, diabetes, and high blood pressure  High blood pressure causes heart disease and increases  the risk of stroke. High blood pressure is more likely to develop in:  People who have blood pressure in the high end of the normal range (130-139/85-89 mm Hg).  People who are overweight or obese.  People who are African American.  If you are 39-56 years of age, have your blood pressure checked every 3-5 years. If you are 79 years of age or older, have your blood pressure checked every year. You should have your blood pressure measured twice-once when you are at a hospital or clinic, and once when you are not at a hospital or clinic. Record the average of the two measurements. To check your blood pressure when you are not at a hospital or clinic, you can use:  An automated blood pressure machine at a pharmacy.  A home blood pressure monitor.  If you are between 46 years and 48 years old, ask your health care provider if you should take aspirin to prevent strokes.  Have regular diabetes screenings. This involves taking a blood  sample to check your fasting blood sugar level.  If you are at a normal weight and have a low risk for diabetes, have this test once every three years after 55 years of age.  If you are overweight and have a high risk for diabetes, consider being tested at a younger age or more often. Preventing infection Hepatitis B  If you have a higher risk for hepatitis B, you should be screened for this virus. You are considered at high risk for hepatitis B if:  You were born in a country where hepatitis B is common. Ask your health care provider which countries are considered high risk.  Your parents were born in a high-risk country, and you have not been immunized against hepatitis B (hepatitis B vaccine).  You have HIV or AIDS.  You use needles to inject street drugs.  You live with someone who has hepatitis B.  You have had sex with someone who has hepatitis B.  You get hemodialysis treatment.  You take certain medicines for conditions, including cancer, organ transplantation, and autoimmune conditions. Hepatitis C  Blood testing is recommended for:  Everyone born from [birth year] through 1965.  Anyone with known risk factors for hepatitis C. Sexually transmitted infections (STIs)  You should be screened for sexually transmitted infections (STIs) including gonorrhea and chlamydia if:  You are sexually active and are younger than 55 years of age.  You are older than 55 years of age and your health care provider tells you that you are at risk for this type of infection.  Your sexual activity has changed since you were last screened and you are at an increased risk for chlamydia or gonorrhea. Ask your health care provider if you are at risk.  If you do not have HIV, but are at risk, it may be recommended that you take a prescription medicine daily to prevent HIV infection. This is called pre-exposure prophylaxis (PrEP). You are considered at risk if:  You are sexually active and do not  regularly use condoms or know the HIV status of your partner(s).  You take drugs by injection.  You are sexually active with a partner who has HIV. Talk with your health care provider about whether you are at high risk of being infected with HIV. If you choose to begin PrEP, you should first be tested for HIV. You should then be tested every 3 months for as long as you are taking PrEP. Pregnancy  If  you are premenopausal and you may become pregnant, ask your health care provider about preconception counseling.  If you may become pregnant, take 400 to 800 micrograms (mcg) of folic acid every day.  If you want to prevent pregnancy, talk to your health care provider about birth control (contraception). Osteoporosis and menopause  Osteoporosis is a disease in which the bones lose minerals and strength with aging. This can result in serious bone fractures. Your risk for osteoporosis can be identified using a bone density scan.  If you are 41 years of age or older, or if you are at risk for osteoporosis and fractures, ask your health care provider if you should be screened.  Ask your health care provider whether you should take a calcium or vitamin D supplement to lower your risk for osteoporosis.  Menopause may have certain physical symptoms and risks.  Hormone replacement therapy may reduce some of these symptoms and risks. Talk to your health care provider about whether hormone replacement therapy is right for you. Follow these instructions at home:  Schedule regular health, dental, and eye exams.  Stay current with your immunizations.  Do not use any tobacco products including cigarettes, chewing tobacco, or electronic cigarettes.  If you are pregnant, do not drink alcohol.  If you are breastfeeding, limit how much and how often you drink alcohol.  Limit alcohol intake to no more than 1 drink per day for nonpregnant women. One drink equals 12 ounces of beer, 5 ounces of wine, or 1  ounces of hard liquor.  Do not use street drugs.  Do not share needles.  Ask your health care provider for help if you need support or information about quitting drugs.  Tell your health care provider if you often feel depressed.  Tell your health care provider if you have ever been abused or do not feel safe at home. This information is not intended to replace advice given to you by your health care provider. Make sure you discuss any questions you have with your health care provider. Document Released: 02/18/2011 Document Revised: 01/11/2016 Document Reviewed: 05/09/2015  2017 Elsevier

## 2016-07-04 NOTE — Progress Notes (Signed)
   Subjective:    Patient ID: Angela Garcia, female    DOB: [birth year], 55 y.o.   MRN: [id number]  HPI The patient is a 55 YO female coming in for wellness. Her husband is still struggling with cancer and they are hoping to try clinical trial.   PMH, [facility], social history reviewed and updated.   Review of Systems  Constitutional: Negative.   HENT: Negative.   Eyes: Negative.   Respiratory: Negative for cough, chest tightness and shortness of breath.   Cardiovascular: Negative for chest pain, palpitations and leg swelling.  Gastrointestinal: Negative for abdominal distention, abdominal pain, constipation, diarrhea, nausea and vomiting.  Musculoskeletal: Negative.   Skin: Negative.   Neurological: Negative.   Psychiatric/Behavioral: Negative.       Objective:   Physical Exam  Constitutional: She is oriented to person, place, and time. She appears well-developed and well-nourished.  HENT:  Head: Normocephalic and atraumatic.  Eyes: EOM are normal.  Neck: Normal range of motion.  Cardiovascular: Normal rate and regular rhythm.   Pulmonary/Chest: Effort normal and breath sounds normal. No respiratory distress. She has no wheezes. She has no rales.  Abdominal: Soft. Bowel sounds are normal. She exhibits no distension. There is no tenderness. There is no rebound.  Musculoskeletal: She exhibits no edema.  Neurological: She is alert and oriented to person, place, and time. Coordination normal.  Skin: Skin is warm and dry.  Psychiatric: She has a normal mood and affect.   Vitals:   07/04/16 0900  BP: 130/80  Pulse: 90  Resp: 16  Temp: 98.4 F (36.9 C)  TempSrc: Oral  SpO2: 98%  Weight: 204 lb (92.5 kg)  Height: 5\' 3"  (1.6 m)      Assessment & Plan:  Flu shot given at visit.

## 2016-07-04 NOTE — Progress Notes (Signed)
Pre visit review using our clinic review tool, if applicable. No additional management support is needed unless otherwise documented below in the visit note. 

## 2016-07-04 NOTE — Assessment & Plan Note (Signed)
BP at goal on amlodipine 10 mg daily. Refilled today and checked CMP and adjust as needed.

## 2016-07-08 ENCOUNTER — Encounter: Payer: Self-pay | Admitting: Internal Medicine

## 2016-07-15 ENCOUNTER — Encounter: Payer: Self-pay | Admitting: Internal Medicine

## 2016-08-06 ENCOUNTER — Ambulatory Visit (INDEPENDENT_AMBULATORY_CARE_PROVIDER_SITE_OTHER): Payer: 59 | Admitting: Nurse Practitioner

## 2016-08-06 ENCOUNTER — Encounter: Payer: Self-pay | Admitting: Nurse Practitioner

## 2016-08-06 VITALS — BP 128/80 | HR 96 | Temp 98.3°F | Ht 63.0 in | Wt 200.0 lb

## 2016-08-06 DIAGNOSIS — R197 Diarrhea, unspecified: Secondary | ICD-10-CM

## 2016-08-06 DIAGNOSIS — R1084 Generalized abdominal pain: Secondary | ICD-10-CM

## 2016-08-06 NOTE — Progress Notes (Signed)
Subjective:  Patient ID: Angela Garcia, female    DOB: 1960-09-01  Age: 55 y.o. MRN: [id number]  CC: Diarrhea (diarrhea,cramps for 1 wk. concern if it is contagious?)   Diarrhea   This is a new problem. The current episode started 1 to 4 weeks ago. The problem occurs 2 to 4 times per day. The problem has been unchanged. The stool consistency is described as watery. The patient states that diarrhea does not awaken her from sleep. Associated symptoms include abdominal pain, bloating and increased flatus. Pertinent negatives include no arthralgias, chills, coughing, fever, headaches, myalgias, sweats, URI, vomiting or weight loss. Risk factors include recent antibiotic use. She has tried nothing for the symptoms.  no blood in stool.  Outpatient Medications Prior to Visit  Medication Sig Dispense Refill  . amLODipine (NORVASC) 10 MG tablet Take 1 tablet (10 mg total) by mouth daily. 90 tablet 3  . cetirizine (ZYRTEC) 10 MG tablet Take 10 mg by mouth daily. Reported on 09/27/2015    . Cholecalciferol (VITAMIN D3) 2000 UNITS capsule Take 2,000 Units by mouth daily.    Marland Kitchen ibuprofen (ADVIL,MOTRIN) 400 MG tablet Take 400 mg by mouth every 6 (six) hours as needed.    . magnesium gluconate (MAGONATE) 30 MG tablet Take 30 mg by mouth 2 (two) times daily.    Marland Kitchen MAGNESIUM PO Take 400 mg by mouth daily.     . metFORMIN (GLUCOPHAGE) 500 MG tablet Take 1 tablet (500 mg total) by mouth daily with breakfast. 90 tablet 3  . Multiple Vitamins-Minerals (MULTIVITAMIN PO) Take 1 tablet by mouth.     . clindamycin (CLEOCIN) 150 MG capsule Take by mouth 3 (three) times daily.    Marland Kitchen dexamethasone (DECADRON) 0.75 MG tablet Take 0.75 mg by mouth 2 (two) times daily with a meal.     No facility-administered medications prior to visit.     ROS See HPI  Objective:  BP 128/80   Pulse 96   Temp 98.3 F (36.8 C)   Ht '5\' 3"'  (1.6 m)   Wt 200 lb (90.7 kg)   LMP 10/04/2013   SpO2 98%   BMI 35.43 kg/m   BP Readings  from Last 3 Encounters:  08/06/16 128/80  07/04/16 130/80  04/29/16 130/84    Wt Readings from Last 3 Encounters:  08/06/16 200 lb (90.7 kg)  07/04/16 204 lb (92.5 kg)  04/29/16 204 lb 9.6 oz (92.8 kg)    Physical Exam  Constitutional: She is oriented to person, place, and time. No distress.  HENT:  Mouth/Throat: Oropharynx is clear and moist.  Cardiovascular: Normal rate.   Pulmonary/Chest: Effort normal.  Abdominal: Soft. Bowel sounds are normal. She exhibits no distension. There is tenderness.  Suprapubic tenderness  Neurological: She is alert and oriented to person, place, and time.  Skin: Skin is warm and dry.  Vitals reviewed.   Lab Results  Component Value Date   WBC 8.4 08/07/2016   HGB 12.7 08/07/2016   HCT 37.2 08/07/2016   PLT 550.0 (H) 08/07/2016   GLUCOSE 133 (H) 08/07/2016   CHOL 222 (H) 07/04/2016   TRIG 95.0 07/04/2016   HDL 61.00 07/04/2016   LDLDIRECT 143.0 06/09/2015   LDLCALC 142 (H) 07/04/2016   ALT 71 (H) 08/07/2016   AST 29 08/07/2016   NA 140 08/07/2016   K 3.2 (L) 08/07/2016   CL 101 08/07/2016   CREATININE 0.65 08/07/2016   BUN 10 08/07/2016   CO2 29 08/07/2016   HGBA1C 5.9  07/04/2016    Mm Screening Breast Tomo Bilateral  Result Date: 07/09/2016 CLINICAL DATA:  Screening. EXAM: 2D DIGITAL SCREENING BILATERAL MAMMOGRAM WITH CAD AND ADJUNCT TOMO COMPARISON:  Previous exam(s). ACR Breast Density Category b: There are scattered areas of fibroglandular density. FINDINGS: There are no findings suspicious for malignancy. Images were processed with CAD. IMPRESSION: No mammographic evidence of malignancy. A result letter of this screening mammogram will be mailed directly to the patient. RECOMMENDATION: Screening mammogram in one year. (Code:SM-B-01Y) BI-RADS CATEGORY  1: Negative. Electronically Signed   By: Dorise Bullion III M.D   On: 07/09/2016 08:09    Assessment & Plan:   Shawnika was seen today for diarrhea.  Diagnoses and all orders  for this visit:  Diarrhea, unspecified type -     Stool Culture; Future -     Clostridium difficile EIA; Future -     metroNIDAZOLE (FLAGYL) 500 MG tablet; Take 1 tablet (500 mg total) by mouth 3 (three) times daily. With food  Generalized abdominal pain -     Urinalysis, Routine w reflex microscopic; Future -     CBC w/Diff; Future -     Comp Met (CMET); Future -     Stool Culture; Future -     metroNIDAZOLE (FLAGYL) 500 MG tablet; Take 1 tablet (500 mg total) by mouth 3 (three) times daily. With food   I am having Ms. Grimmett start on metroNIDAZOLE. I am also having her maintain her MAGNESIUM PO, Multiple Vitamins-Minerals (MULTIVITAMIN PO), Vitamin D3, cetirizine, ibuprofen, magnesium gluconate, clindamycin, dexamethasone, metFORMIN, amLODipine, and phentermine.  Meds ordered this encounter  Medications  . phentermine (ADIPEX-P) 37.5 MG tablet    Refill:  0  . metroNIDAZOLE (FLAGYL) 500 MG tablet    Sig: Take 1 tablet (500 mg total) by mouth 3 (three) times daily. With food    Dispense:  30 tablet    Refill:  0    Order Specific Question:   Supervising Provider    Answer:   Cassandria Anger [[id number]]    Follow-up: Return if symptoms worsen or fail to improve.  Wilfred Lacy, NP

## 2016-08-06 NOTE — Progress Notes (Signed)
Pre visit review using our clinic review tool, if applicable. No additional management support is needed unless otherwise documented below in the visit note. 

## 2016-08-06 NOTE — Patient Instructions (Addendum)
Florastor or curturelle probiotics. Stool positive for c.difficile GDH, but toxin not detected. Pending PCR  Bland Diet Introduction A bland diet consists of foods that do not have a lot of fat or fiber. Foods without fat or fiber are easier for the body to digest. They are also less likely to irritate your mouth, throat, stomach, and other parts of your gastrointestinal tract. A bland diet is sometimes called a BRAT diet. What is my plan? Your health care provider or dietitian may recommend specific changes to your diet to prevent and treat your symptoms, such as:  Eating small meals often.  Cooking food until it is soft enough to chew easily.  Chewing your food well.  Drinking fluids slowly.  Not eating foods that are very spicy, sour, or fatty.  Not eating citrus fruits, such as oranges and grapefruit. What do I need to know about this diet?  Eat a variety of foods from the bland diet food list.  Do not follow a bland diet longer than you have to.  Ask your health care provider whether you should take vitamins. What foods can I eat? Grains  Hot cereals, such as cream of wheat. Bread, crackers, or tortillas made from refined white flour. Rice. Vegetables  Canned or cooked vegetables. Mashed or boiled potatoes. Fruits  Bananas. Applesauce. Other types of cooked or canned fruit with the skin and seeds removed, such as canned peaches or pears. Meats and Other Protein Sources  Scrambled eggs. Creamy peanut butter or other nut butters. Lean, well-cooked meats, such as chicken or fish. Tofu. Soups or broths. Dairy  Low-fat dairy products, such as milk, cottage cheese, or yogurt. Beverages  Water. Herbal tea. Apple juice. Sweets and Desserts  Pudding. Custard. Fruit gelatin. Ice cream. Fats and Oils  Mild salad dressings. Canola or olive oil. The items listed above may not be a complete list of allowed foods or beverages. Contact your dietitian for more options.  What foods  are not recommended? Foods and ingredients that are often not recommended include:  Spicy foods, such as hot sauce or salsa.  Fried foods.  Sour foods, such as pickled or fermented foods.  Raw vegetables or fruits, especially citrus or berries.  Caffeinated drinks.  Alcohol.  Strongly flavored seasonings or condiments. The items listed above may not be a complete list of foods and beverages that are not allowed. Contact your dietitian for more information.  This information is not intended to replace advice given to you by your health care provider. Make sure you discuss any questions you have with your health care provider. Document Released: 11/27/2015 Document Revised: 01/11/2016 Document Reviewed: 08/17/2014  2017 Elsevier

## 2016-08-07 ENCOUNTER — Other Ambulatory Visit (INDEPENDENT_AMBULATORY_CARE_PROVIDER_SITE_OTHER): Payer: 59

## 2016-08-07 ENCOUNTER — Other Ambulatory Visit: Payer: Self-pay | Admitting: Nurse Practitioner

## 2016-08-07 DIAGNOSIS — R197 Diarrhea, unspecified: Secondary | ICD-10-CM

## 2016-08-07 DIAGNOSIS — R748 Abnormal levels of other serum enzymes: Secondary | ICD-10-CM

## 2016-08-07 DIAGNOSIS — R1084 Generalized abdominal pain: Secondary | ICD-10-CM

## 2016-08-07 LAB — URINALYSIS, ROUTINE W REFLEX MICROSCOPIC
Bilirubin Urine: NEGATIVE
Hgb urine dipstick: NEGATIVE
Ketones, ur: NEGATIVE
Leukocytes, UA: NEGATIVE
Nitrite: NEGATIVE
Specific Gravity, Urine: 1.02 (ref 1.000–1.030)
Total Protein, Urine: 30 — AB
Urine Glucose: NEGATIVE
Urobilinogen, UA: 0.2 (ref 0.0–1.0)
pH: 6.5 (ref 5.0–8.0)

## 2016-08-07 LAB — COMPREHENSIVE METABOLIC PANEL
ALT: 71 U/L — ABNORMAL HIGH (ref 0–35)
AST: 29 U/L (ref 0–37)
Albumin: 3.9 g/dL (ref 3.5–5.2)
Alkaline Phosphatase: 139 U/L — ABNORMAL HIGH (ref 39–117)
BUN: 10 mg/dL (ref 6–23)
CO2: 29 mEq/L (ref 19–32)
Calcium: 8.7 mg/dL (ref 8.4–10.5)
Chloride: 101 mEq/L (ref 96–112)
Creatinine, Ser: 0.65 mg/dL (ref 0.40–1.20)
GFR: 100.55 mL/min (ref >60.00)
Glucose, Bld: 133 mg/dL — ABNORMAL HIGH (ref 70–99)
Potassium: 3.2 mEq/L — ABNORMAL LOW (ref 3.5–5.1)
Sodium: 140 mEq/L (ref 135–145)
Total Bilirubin: 0.4 mg/dL (ref 0.2–1.2)
Total Protein: 6.8 g/dL (ref 6.0–8.3)

## 2016-08-07 LAB — CBC WITH DIFFERENTIAL/PLATELET
Basophils Absolute: 0 10*3/uL (ref 0.0–0.1)
Basophils Relative: 0.5 % (ref 0.0–3.0)
Eosinophils Absolute: 0.1 10*3/uL (ref 0.0–0.7)
Eosinophils Relative: 1.6 % (ref 0.0–5.0)
HCT: 37.2 % (ref 36.0–46.0)
Hemoglobin: 12.7 g/dL (ref 12.0–15.0)
Lymphocytes Relative: 37.4 % (ref 12.0–46.0)
Lymphs Abs: 3.1 10*3/uL (ref 0.7–4.0)
MCHC: 34.2 g/dL (ref 30.0–36.0)
MCV: 86.4 fl (ref 78.0–100.0)
Monocytes Absolute: 0.9 10*3/uL (ref 0.1–1.0)
Monocytes Relative: 11.3 % (ref 3.0–12.0)
Neutro Abs: 4.1 10*3/uL (ref 1.4–7.7)
Neutrophils Relative %: 49.2 % (ref 43.0–77.0)
Platelets: 550 10*3/uL — ABNORMAL HIGH (ref 150.0–400.0)
RBC: 4.3 Mil/uL (ref 3.87–5.11)
RDW: 12.6 % (ref 11.5–15.5)
WBC: 8.4 10*3/uL (ref 4.0–10.5)

## 2016-08-07 NOTE — Addendum Note (Signed)
Addended by: Sallye Ober on: 08/07/2016 04:35 PM   Modules accepted: Orders

## 2016-08-08 ENCOUNTER — Other Ambulatory Visit: Payer: Self-pay | Admitting: Nurse Practitioner

## 2016-08-08 ENCOUNTER — Telehealth: Payer: Self-pay | Admitting: Emergency Medicine

## 2016-08-08 DIAGNOSIS — A09 Infectious gastroenteritis and colitis, unspecified: Secondary | ICD-10-CM

## 2016-08-08 LAB — C. DIFFICILE GDH AND TOXIN A/B
C. difficile GDH: DETECTED — AB
C. difficile Toxin A/B: NOT DETECTED

## 2016-08-08 LAB — CLOSTRIDIUM DIFFICILE BY PCR: Toxigenic C. Difficile by PCR: DETECTED — CR

## 2016-08-08 MED ORDER — VANCOMYCIN 50 MG/ML ORAL SOLUTION: 125.0000 mg | Freq: Four times a day (QID) | ORAL | 0 refills | Status: AC

## 2016-08-08 MED ORDER — METRONIDAZOLE 500 MG PO TABS: 500.0000 mg | ORAL_TABLET | Freq: Three times a day (TID) | ORAL | 0 refills | Status: DC

## 2016-08-08 NOTE — Telephone Encounter (Signed)
Rx faxed to [facility] out patient pharmacy, advise [facility]. Pt verbalize understand.

## 2016-08-08 NOTE — Telephone Encounter (Signed)
Received call from [facility] stating the pt was positive for C-diff

## 2016-08-08 NOTE — Telephone Encounter (Signed)
Please inform patient that stool is positive for C.Difficlile. She will need vancomycin to treat infection. Start medication today Do not pick up metronidazole prescription. Hand hygiene with soap and water is the key to minimize transmission to others.

## 2016-08-11 LAB — STOOL CULTURE

## 2016-08-14 NOTE — Progress Notes (Signed)
Normal results, see office note

## 2016-09-05 ENCOUNTER — Other Ambulatory Visit: Payer: Self-pay

## 2016-09-05 DIAGNOSIS — L299 Pruritus, unspecified: Secondary | ICD-10-CM | POA: Diagnosis not present

## 2016-09-05 DIAGNOSIS — D229 Melanocytic nevi, unspecified: Secondary | ICD-10-CM | POA: Diagnosis not present

## 2016-09-05 DIAGNOSIS — D492 Neoplasm of unspecified behavior of bone, soft tissue, and skin: Secondary | ICD-10-CM | POA: Diagnosis not present

## 2016-09-05 DIAGNOSIS — C44619 Basal cell carcinoma of skin of left upper limb, including shoulder: Secondary | ICD-10-CM | POA: Diagnosis not present

## 2016-10-24 DIAGNOSIS — C44619 Basal cell carcinoma of skin of left upper limb, including shoulder: Secondary | ICD-10-CM | POA: Diagnosis not present

## 2016-12-30 ENCOUNTER — Emergency Department
Admission: EM | Admit: 2016-12-30 | Discharge: 2016-12-30 | Disposition: A | Discharge status: Home / Self Care | Payer: 59 | Source: Home / Self Care | Attending: Family Medicine | Admitting: Family Medicine

## 2016-12-30 ENCOUNTER — Encounter: Payer: Self-pay | Admitting: Emergency Medicine

## 2016-12-30 DIAGNOSIS — J028 Acute pharyngitis due to other specified organisms: Secondary | ICD-10-CM

## 2016-12-30 LAB — POCT RAPID STREP A (OFFICE): Rapid Strep A Screen: NEGATIVE

## 2016-12-30 NOTE — ED Provider Notes (Signed)
CSN: [id number]     Arrival date & time 12/30/16  1046 History   First MD Initiated Contact with Patient 12/30/16 1130     Chief Complaint  Patient presents with  . Sore Throat   (Consider location/radiation/quality/duration/timing/severity/associated sxs/prior Treatment) HPI  Angela Garcia is a 56 y.o. female presenting to UC with c/o 3 days of nasal congestion, sore throat, fever Tmax 100.3*F yesterday, and mild fatigue. She notes she is going out of town to celebrate her anniversary with her husband who has colon cancer and hopes to be better by then and also hopes to not spread it to him.  Denies n/v/d. No difficulty breathing or swallowing. No known sick contacts but note she was in a hospital with family last weekend.   Past Medical History:  Diagnosis Date  . Fibroid   . Headache(784.0)    history - last one 4 months ago  . Hypertension   . PONV (postoperative nausea and vomiting)    Past Surgical History:  Procedure Laterality Date  . CATARACT EXTRACTION  05/2011   Bilateral  . CHOLECYSTECTOMY  2007  . COLONOSCOPY  01/21/14   Normal - Every 10 years  . CYST EXCISION Left 2002   Sebaceous removed from Breast - left benign  . EYE SURGERY    . HYSTEROSCOPY N/A 05/13/2014   Procedure: HYSTEROSCOPY with truclear  ;  Surgeon: Azalia Bilis, MD;  Location: [facility];  Service: Gynecology;  Laterality: N/A;   Family History  Problem Relation Age of Onset  . Cancer Mother        Ovarian/Cevical?  . Lung cancer Mother   . Ovarian cancer Mother   . Lung cancer Father   . Heart attack Father 21  . Stroke Father        Late 17's  . Hypertension Father   . Ovarian cancer Sister   . Heart disease Sister   . Cancer Sister   . Lung cancer Maternal Uncle   . Heart attack Paternal Grandmother   . Congestive Heart Failure Paternal Grandmother    Social History  Substance Use Topics  . Smoking status: Never Smoker  . Smokeless tobacco: Never Used  . Alcohol use 0.6 - 1.2  oz/week    1 - 2 Standard drinks or equivalent per week   OB History    Gravida Para Term Preterm AB Living   0 0 0 0 0 0   SAB TAB Ectopic Multiple Live Births   0 0 0 0       Review of Systems  Constitutional: Positive for fatigue and fever. Negative for chills.  HENT: Positive for congestion and sore throat. Negative for ear pain, trouble swallowing and voice change.   Respiratory: Negative for cough and shortness of breath.   Cardiovascular: Negative for chest pain and palpitations.  Gastrointestinal: Negative for abdominal pain, diarrhea, nausea and vomiting.  Musculoskeletal: Negative for arthralgias, back pain and myalgias.  Skin: Negative for rash.    Allergies  Clindamycin/lincomycin and Codeine  Home Medications   Prior to Admission medications   Medication Sig Start Date End Date Taking? Authorizing Provider  amLODipine (NORVASC) 10 MG tablet Take 1 tablet (10 mg total) by mouth daily. 07/04/16   Hoyt Koch, MD  cetirizine (ZYRTEC) 10 MG tablet Take 10 mg by mouth daily. Reported on 09/27/2015    [provider]  Cholecalciferol (VITAMIN D3) 2000 UNITS capsule Take 2,000 Units by mouth daily.    [provider]  dexamethasone (DECADRON) 0.75 MG tablet Take 0.75 mg by mouth 2 (two) times daily with a meal.    [provider]  ibuprofen (ADVIL,MOTRIN) 400 MG tablet Take 400 mg by mouth every 6 (six) hours as needed.    [provider]  magnesium gluconate (MAGONATE) 30 MG tablet Take 30 mg by mouth 2 (two) times daily.    [provider]  MAGNESIUM PO Take 400 mg by mouth daily.     [provider]  metFORMIN (GLUCOPHAGE) 500 MG tablet Take 1 tablet (500 mg total) by mouth daily with breakfast. 07/04/16   Hoyt Koch, MD  Multiple Vitamins-Minerals (MULTIVITAMIN PO) Take 1 tablet by mouth.     [provider]  phentermine (ADIPEX-P) 37.5 MG tablet  07/17/16   [provider]    Meds Ordered and Administered this Visit  Medications - No data to display  BP 125/80 (BP Location: Left Arm)   Pulse 97   Temp 99.4 F (37.4 C) (Oral)   Ht 5\' 3"  (1.6 m)   Wt 203 lb (92.1 kg)   LMP 10/04/2013   SpO2 98%   BMI 35.96 kg/m  No data found.   Physical Exam  Constitutional: She is oriented to person, place, and time. She appears well-developed and well-nourished. No distress.  HENT:  Head: Normocephalic and atraumatic.  Right Ear: Tympanic membrane normal.  Left Ear: Tympanic membrane normal.  Nose: Nose normal.  Mouth/Throat: Uvula is midline and mucous membranes are normal. Posterior oropharyngeal erythema present. No oropharyngeal exudate, posterior oropharyngeal edema or tonsillar abscesses.  Eyes: EOM are normal.  Neck: Normal range of motion. Neck supple.  Cardiovascular: Normal rate and regular rhythm.   Pulmonary/Chest: Effort normal and breath sounds normal. No stridor. No respiratory distress. She has no wheezes. She has no rales.  Musculoskeletal: Normal range of motion.  Lymphadenopathy:    She has cervical adenopathy.  Neurological: She is alert and oriented to person, place, and time.  Skin: Skin is warm and dry. She is not diaphoretic.  Psychiatric: She has a normal mood and affect. Her behavior is normal.  Nursing note and vitals reviewed.   Urgent Care Course     Procedures (including critical care time)  Labs Review Labs Reviewed  STREP A DNA PROBE  POCT RAPID STREP A (OFFICE)    Imaging Review No results found.    MDM   1. Pharyngitis due to other organism    Rapid strep: Negative Culture sent.  Encouraged symptomatic treatment with acetaminophen and ibuprofen Encouraged good hand hygiene and not to share food/drink to help prevent spread of illness.  f/u with PCP in 1 week if not improving.     Noland Fordyce, PA-C 12/30/16 1257

## 2016-12-30 NOTE — ED Triage Notes (Signed)
Sore throat, fever, congestion x 3 days

## 2016-12-31 ENCOUNTER — Telehealth: Payer: Self-pay | Admitting: *Deleted

## 2016-12-31 LAB — STREP A DNA PROBE: GASP: NOT DETECTED

## 2016-12-31 NOTE — Telephone Encounter (Signed)
Callback: No answer, left message on personal mobile, TCX negative. Call back as needed.

## 2017-01-23 DIAGNOSIS — D229 Melanocytic nevi, unspecified: Secondary | ICD-10-CM | POA: Diagnosis not present

## 2017-01-23 DIAGNOSIS — Z85828 Personal history of other malignant neoplasm of skin: Secondary | ICD-10-CM | POA: Diagnosis not present

## 2017-04-30 ENCOUNTER — Ambulatory Visit: Payer: 59 | Admitting: Obstetrics and Gynecology

## 2017-07-08 ENCOUNTER — Other Ambulatory Visit: Payer: Self-pay | Admitting: Obstetrics and Gynecology

## 2017-07-08 DIAGNOSIS — Z1231 Encounter for screening mammogram for malignant neoplasm of breast: Secondary | ICD-10-CM

## 2017-08-08 ENCOUNTER — Ambulatory Visit: Payer: 59

## 2017-08-20 ENCOUNTER — Other Ambulatory Visit: Payer: Self-pay

## 2017-08-20 ENCOUNTER — Encounter: Payer: Self-pay | Admitting: Obstetrics and Gynecology

## 2017-08-20 ENCOUNTER — Ambulatory Visit (INDEPENDENT_AMBULATORY_CARE_PROVIDER_SITE_OTHER): Payer: 59 | Admitting: Obstetrics and Gynecology

## 2017-08-20 VITALS — BP 168/86 | HR 88 | Resp 16 | Ht 62.0 in | Wt 208.0 lb

## 2017-08-20 DIAGNOSIS — Z01419 Encounter for gynecological examination (general) (routine) without abnormal findings: Secondary | ICD-10-CM

## 2017-08-20 DIAGNOSIS — Z124 Encounter for screening for malignant neoplasm of cervix: Secondary | ICD-10-CM

## 2017-08-20 NOTE — Progress Notes (Signed)
57 y.o. G0P0000 MarriedCaucasianF here for annual exam.  Husband has metastatics colon cancer (liver, lungs, lymph, abdominal wall), has at home hospice care. Diagnosed in June 2016. He also has a brain gleoma.  He is sleeping a lot, vomiting. Has lost 32 lbs in 6 weeks, in pain. He is still working from home in [place]. He is working for [employer] and life insurance. She is exhausted and sad. She has great friends, close to her niece.  No vaginal bleeding.     Patient's last menstrual period was 10/04/2013.          Sexually active: No.  The current method of family planning is post menopausal status.    Exercising: No.  The patient does not participate in regular exercise at present. Smoker:  no  Health Maintenance: Pap:  12-14-13 WNL  History of abnormal Pap:  no MMG:  07-04-16 WNL, has an appointment Colonoscopy:  01-21-14 WNL repeat in 5 yrs  BMD:   Never TDaP:  Up to date Gardasil: N/A   reports that  has never smoked. she has never used smokeless tobacco. She reports that she drinks about 0.6 - 1.2 oz of alcohol per week. She reports that she does not use drugs. She works in [employer], working part time, will go to full time when she able. Used to manage medical practices. Has been married x 18 years.   Past Medical History:  Diagnosis Date  . Fibroid   . Headache(784.0)    history - last one 4 months ago  . Hypertension   . PONV (postoperative nausea and vomiting)   . Prediabetes     Past Surgical History:  Procedure Laterality Date  . CATARACT EXTRACTION  05/2011   Bilateral  . CHOLECYSTECTOMY  2007  . COLONOSCOPY  01/21/14   Normal - Every 10 years  . CYST EXCISION Left 2002   Sebaceous removed from Breast - left benign  . EYE SURGERY    . HYSTEROSCOPY N/A 05/13/2014   Procedure: HYSTEROSCOPY with truclear  ;  Surgeon: Azalia Bilis, MD;  Location: [facility];  Service: Gynecology;  Laterality: N/A;    Current Outpatient Medications  Medication Sig Dispense  Refill  . amLODipine (NORVASC) 10 MG tablet Take 1 tablet (10 mg total) by mouth daily. 90 tablet 3  . cetirizine (ZYRTEC) 10 MG tablet Take 10 mg by mouth daily. Reported on 09/27/2015    . Cholecalciferol (VITAMIN D3) 2000 UNITS capsule Take 2,000 Units by mouth daily.    Marland Kitchen ibuprofen (ADVIL,MOTRIN) 400 MG tablet Take 400 mg by mouth every 6 (six) hours as needed.    . magnesium gluconate (MAGONATE) 30 MG tablet Take 30 mg by mouth 2 (two) times daily.    Marland Kitchen MAGNESIUM PO Take 400 mg by mouth daily.     . metFORMIN (GLUCOPHAGE) 500 MG tablet Take 1 tablet (500 mg total) by mouth daily with breakfast. 90 tablet 3  . Multiple Vitamins-Minerals (MULTIVITAMIN PO) Take 1 tablet by mouth.     . phentermine (ADIPEX-P) 37.5 MG tablet   0   No current facility-administered medications for this visit.     Family History  Problem Relation Age of Onset  . Cancer Mother        Ovarian/Cevical?  . Lung cancer Mother   . Ovarian cancer Mother   . Lung cancer Father   . Heart attack Father 80  . Stroke Father        Late 38's  .  Hypertension Father   . Ovarian cancer Sister   . Heart disease Sister   . Cancer Sister   . Lung cancer Maternal Uncle   . Heart attack Paternal Grandmother   . Congestive Heart Failure Paternal Grandmother     Review of Systems  Constitutional: Negative.   HENT: Negative.   Eyes: Negative.   Respiratory: Negative.   Cardiovascular: Negative.   Gastrointestinal: Negative.   Endocrine: Negative.   Genitourinary: Negative.   Musculoskeletal: Negative.   Skin: Negative.   Allergic/Immunologic: Negative.   Neurological: Negative.   Hematological: Negative.   Psychiatric/Behavioral: Negative.     Exam:   BP (!) 168/86 (BP Location: Right Arm, Patient Position: Sitting, Cuff Size: Normal)   Pulse 88   Resp 16   Ht 5\' 2"  (1.575 m)   Wt 208 lb (94.3 kg)   LMP 10/04/2013   BMI 38.04 kg/m   Weight change: @WEIGHTCHANGE @ Height:   Height: 5\' 2"  (157.5 cm)  Ht  Readings from Last 3 Encounters:  08/20/17 5\' 2"  (1.575 m)  12/30/16 5\' 3"  (1.6 m)  08/06/16 5\' 3"  (1.6 m)    General appearance: alert, cooperative and appears stated age Head: Normocephalic, without obvious abnormality, atraumatic Neck: no adenopathy, supple, symmetrical, trachea midline and thyroid normal to inspection and palpation Lungs: clear to auscultation bilaterally Cardiovascular: regular rate and rhythm Breasts: normal appearance, no masses or tenderness Abdomen: soft, non-tender; non distended,  no masses,  no organomegaly Extremities: extremities normal, atraumatic, no cyanosis or edema Skin: Skin color, texture, turgor normal. No rashes or lesions Lymph nodes: Cervical, supraclavicular, and axillary nodes normal. No abnormal inguinal nodes palpated Neurologic: Grossly normal   Pelvic: External genitalia:  no lesions              Urethra:  normal appearing urethra with no masses, tenderness or lesions              Bartholins and Skenes: normal                 Vagina: normal appearing vagina with normal color and discharge, no lesions              Cervix: no lesions               Bimanual Exam:  Uterus:  not appreciably enlarged, not tender              Adnexa: no mass, fullness, tenderness               Rectovaginal: Confirms               Anus:  normal sphincter tone, no lesions  Chaperone was present for exam.  A:  Well Woman with normal exam  Husband in hospice, she is holding up okay, has support. Denies depression  P:   Pap with HPV  Mammogram scheduled  Colonoscopy UTD  Discussed breast self exam  Discussed calcium and vit D intake

## 2017-08-20 NOTE — Patient Instructions (Signed)

## 2017-08-22 ENCOUNTER — Other Ambulatory Visit (HOSPITAL_COMMUNITY)
Admission: RE | Admit: 2017-08-22 | Discharge: 2017-08-22 | Disposition: A | Discharge status: Home / Self Care | Payer: 59 | Source: Ambulatory Visit | Attending: Obstetrics and Gynecology | Admitting: Obstetrics and Gynecology

## 2017-08-22 DIAGNOSIS — Z124 Encounter for screening for malignant neoplasm of cervix: Secondary | ICD-10-CM | POA: Insufficient documentation

## 2017-08-22 NOTE — Addendum Note (Signed)
Addended by: Dorothy Spark on: 08/22/2017 01:15 PM   Modules accepted: Orders

## 2017-08-26 LAB — CYTOLOGY - PAP
Diagnosis: NEGATIVE
HPV: NOT DETECTED

## 2017-09-01 ENCOUNTER — Ambulatory Visit
Admission: RE | Admit: 2017-09-01 | Discharge: 2017-09-01 | Disposition: A | Discharge status: Home / Self Care | Payer: 59 | Source: Ambulatory Visit | Attending: Obstetrics and Gynecology | Admitting: Obstetrics and Gynecology

## 2017-09-01 DIAGNOSIS — Z1231 Encounter for screening mammogram for malignant neoplasm of breast: Secondary | ICD-10-CM

## 2017-09-22 ENCOUNTER — Other Ambulatory Visit: Payer: Self-pay | Admitting: Internal Medicine

## 2017-10-04 DIAGNOSIS — H52223 Regular astigmatism, bilateral: Secondary | ICD-10-CM | POA: Diagnosis not present

## 2017-10-04 DIAGNOSIS — H524 Presbyopia: Secondary | ICD-10-CM | POA: Diagnosis not present

## 2017-10-04 DIAGNOSIS — H5203 Hypermetropia, bilateral: Secondary | ICD-10-CM | POA: Diagnosis not present

## 2018-02-12 ENCOUNTER — Other Ambulatory Visit: Payer: Self-pay

## 2018-02-12 DIAGNOSIS — L959 Vasculitis limited to the skin, unspecified: Secondary | ICD-10-CM | POA: Diagnosis not present

## 2018-02-12 DIAGNOSIS — D229 Melanocytic nevi, unspecified: Secondary | ICD-10-CM | POA: Diagnosis not present

## 2018-02-12 DIAGNOSIS — D485 Neoplasm of uncertain behavior of skin: Secondary | ICD-10-CM | POA: Diagnosis not present

## 2018-02-27 ENCOUNTER — Ambulatory Visit: Payer: Self-pay | Admitting: Internal Medicine

## 2018-02-27 ENCOUNTER — Other Ambulatory Visit (INDEPENDENT_AMBULATORY_CARE_PROVIDER_SITE_OTHER): Payer: 59

## 2018-02-27 ENCOUNTER — Encounter: Payer: Self-pay | Admitting: Internal Medicine

## 2018-02-27 VITALS — BP 138/88 | HR 78 | Temp 97.8°F | Ht 62.0 in | Wt 208.0 lb

## 2018-02-27 DIAGNOSIS — Z6838 Body mass index (BMI) 38.0-38.9, adult: Secondary | ICD-10-CM | POA: Diagnosis not present

## 2018-02-27 DIAGNOSIS — Z0000 Encounter for general adult medical examination without abnormal findings: Secondary | ICD-10-CM

## 2018-02-27 DIAGNOSIS — E6609 Other obesity due to excess calories: Secondary | ICD-10-CM | POA: Diagnosis not present

## 2018-02-27 DIAGNOSIS — I10 Essential (primary) hypertension: Secondary | ICD-10-CM | POA: Diagnosis not present

## 2018-02-27 LAB — CBC
HCT: 38.9 % (ref 36.0–46.0)
Hemoglobin: 13.1 g/dL (ref 12.0–15.0)
MCHC: 33.6 g/dL (ref 30.0–36.0)
MCV: 88.3 fl (ref 78.0–100.0)
Platelets: 387 10*3/uL (ref 150.0–400.0)
RBC: 4.41 Mil/uL (ref 3.87–5.11)
RDW: 13.3 % (ref 11.5–15.5)
WBC: 8.4 10*3/uL (ref 4.0–10.5)

## 2018-02-27 LAB — COMPREHENSIVE METABOLIC PANEL
ALT: 18 U/L (ref 0–35)
AST: 17 U/L (ref 0–37)
Albumin: 4.2 g/dL (ref 3.5–5.2)
Alkaline Phosphatase: 107 U/L (ref 39–117)
BUN: 16 mg/dL (ref 6–23)
CO2: 29 mEq/L (ref 19–32)
Calcium: 8.8 mg/dL (ref 8.4–10.5)
Chloride: 103 mEq/L (ref 96–112)
Creatinine, Ser: 0.66 mg/dL (ref 0.40–1.20)
GFR: 98.24 mL/min (ref >60.00)
Glucose, Bld: 108 mg/dL — ABNORMAL HIGH (ref 70–99)
Potassium: 4.1 mEq/L (ref 3.5–5.1)
Sodium: 138 mEq/L (ref 135–145)
Total Bilirubin: 0.5 mg/dL (ref 0.2–1.2)
Total Protein: 7 g/dL (ref 6.0–8.3)

## 2018-02-27 LAB — LIPID PANEL
Cholesterol: 193 mg/dL (ref 0–200)
HDL: 52.5 mg/dL (ref >39.00)
LDL Cholesterol: 102 mg/dL — ABNORMAL HIGH (ref 0–99)
NonHDL: 140.23
Total CHOL/HDL Ratio: 4
Triglycerides: 191 mg/dL — ABNORMAL HIGH (ref 0.0–149.0)
VLDL: 38.2 mg/dL (ref 0.0–40.0)

## 2018-02-27 LAB — HEMOGLOBIN A1C: Hgb A1c MFr Bld: 6.4 % (ref 4.6–6.5)

## 2018-02-27 MED ORDER — FUROSEMIDE 20 MG PO TABS: 20.0000 mg | ORAL_TABLET | Freq: Every day | ORAL | 3 refills | Status: DC

## 2018-02-27 NOTE — Assessment & Plan Note (Signed)
Mammogram and pap smear and colonoscopy up to date. Declines shingrix. Flu reminded. Tetanus up to date. Declines hiv screening.

## 2018-02-27 NOTE — Assessment & Plan Note (Signed)
Change amlodipine to lasix for fluid. Checking CMP and adjust as needed.

## 2018-02-27 NOTE — Patient Instructions (Signed)
We have sent in the fluid pill to use as needed daily.   We will check the labs today and call you back about the results.    Health Maintenance, Female Adopting a healthy lifestyle and getting preventive care can go a long way to promote health and wellness. Talk with your health care provider about what schedule of regular examinations is right for you. This is a good chance for you to check in with your provider about disease prevention and staying healthy. In between checkups, there are plenty of things you can do on your own. Experts have done a lot of research about which lifestyle changes and preventive measures are most likely to keep you healthy. Ask your health care provider for more information. Weight and diet Eat a healthy diet  Be sure to include plenty of vegetables, fruits, low-fat dairy products, and lean protein.  Do not eat a lot of foods high in solid fats, added sugars, or salt.  Get regular exercise. This is one of the most important things you can do for your health. ? Most adults should exercise for at least 150 minutes each week. The exercise should increase your heart rate and make you sweat (moderate-intensity exercise). ? Most adults should also do strengthening exercises at least twice a week. This is in addition to the moderate-intensity exercise.  Maintain a healthy weight  Body mass index (BMI) is a measurement that can be used to identify possible weight problems. It estimates body fat based on height and weight. Your health care provider can help determine your BMI and help you achieve or maintain a healthy weight.  For females 3 years of age and older: ? A BMI below 18.5 is considered underweight. ? A BMI of 18.5 to 24.9 is normal. ? A BMI of 25 to 29.9 is considered overweight. ? A BMI of 30 and above is considered obese.  Watch levels of cholesterol and blood lipids  You should start having your blood tested for lipids and cholesterol at 57 years  of age, then have this test every 5 years.  You may need to have your cholesterol levels checked more often if: ? Your lipid or cholesterol levels are high. ? You are older than 57 years of age. ? You are at high risk for heart disease.  Cancer screening Lung Cancer  Lung cancer screening is recommended for adults 96-37 years old who are at high risk for lung cancer because of a history of smoking.  A yearly low-dose CT scan of the lungs is recommended for people who: ? Currently smoke. ? Have quit within the past 15 years. ? Have at least a 30-pack-year history of smoking. A pack year is smoking an average of one pack of cigarettes a day for 1 year.  Yearly screening should continue until it has been 15 years since you quit.  Yearly screening should stop if you develop a health problem that would prevent you from having lung cancer treatment.  Breast Cancer  Practice breast self-awareness. This means understanding how your breasts normally appear and feel.  It also means doing regular breast self-exams. Let your health care provider know about any changes, no matter how small.  If you are in your 20s or 30s, you should have a clinical breast exam (CBE) by a health care provider every 1-3 years as part of a regular health exam.  If you are 18 or older, have a CBE every year. Also consider having a breast  X-ray (mammogram) every year.  If you have a family history of breast cancer, talk to your health care provider about genetic screening.  If you are at high risk for breast cancer, talk to your health care provider about having an MRI and a mammogram every year.  Breast cancer gene (BRCA) assessment is recommended for women who have family members with BRCA-related cancers. BRCA-related cancers include: ? Breast. ? Ovarian. ? Tubal. ? Peritoneal cancers.  Results of the assessment will determine the need for genetic counseling and BRCA1 and BRCA2 testing.  Cervical  Cancer Your health care provider may recommend that you be screened regularly for cancer of the pelvic organs (ovaries, uterus, and vagina). This screening involves a pelvic examination, including checking for microscopic changes to the surface of your cervix (Pap test). You may be encouraged to have this screening done every 3 years, beginning at age 78.  For women ages 57-65, health care providers may recommend pelvic exams and Pap testing every 3 years, or they may recommend the Pap and pelvic exam, combined with testing for human papilloma virus (HPV), every 5 years. Some types of HPV increase your risk of cervical cancer. Testing for HPV may also be done on women of any age with unclear Pap test results.  Other health care providers may not recommend any screening for nonpregnant women who are considered low risk for pelvic cancer and who do not have symptoms. Ask your health care provider if a screening pelvic exam is right for you.  If you have had past treatment for cervical cancer or a condition that could lead to cancer, you need Pap tests and screening for cancer for at least 20 years after your treatment. If Pap tests have been discontinued, your risk factors (such as having a new sexual partner) need to be reassessed to determine if screening should resume. Some women have medical problems that increase the chance of getting cervical cancer. In these cases, your health care provider may recommend more frequent screening and Pap tests.  Colorectal Cancer  This type of cancer can be detected and often prevented.  Routine colorectal cancer screening usually begins at 57 years of age and continues through 57 years of age.  Your health care provider may recommend screening at an earlier age if you have risk factors for colon cancer.  Your health care provider may also recommend using home test kits to check for hidden blood in the stool.  A small camera at the end of a tube can be used to  examine your colon directly (sigmoidoscopy or colonoscopy). This is done to check for the earliest forms of colorectal cancer.  Routine screening usually begins at age 36.  Direct examination of the colon should be repeated every 5-10 years through 57 years of age. However, you may need to be screened more often if early forms of precancerous polyps or small growths are found.  Skin Cancer  Check your skin from head to toe regularly.  Tell your health care provider about any new moles or changes in moles, especially if there is a change in a mole's shape or color.  Also tell your health care provider if you have a mole that is larger than the size of a pencil eraser.  Always use sunscreen. Apply sunscreen liberally and repeatedly throughout the day.  Protect yourself by wearing long sleeves, pants, a wide-brimmed hat, and sunglasses whenever you are outside.  Heart disease, diabetes, and high blood pressure  High blood  pressure causes heart disease and increases the risk of stroke. High blood pressure is more likely to develop in: ? People who have blood pressure in the high end of the normal range (130-139/85-89 mm Hg). ? People who are overweight or obese. ? People who are African American.  If you are 73-13 years of age, have your blood pressure checked every 3-5 years. If you are 25 years of age or older, have your blood pressure checked every year. You should have your blood pressure measured twice-once when you are at a hospital or clinic, and once when you are not at a hospital or clinic. Record the average of the two measurements. To check your blood pressure when you are not at a hospital or clinic, you can use: ? An automated blood pressure machine at a pharmacy. ? A home blood pressure monitor.  If you are between 32 years and 64 years old, ask your health care provider if you should take aspirin to prevent strokes.  Have regular diabetes screenings. This involves taking a  blood sample to check your fasting blood sugar level. ? If you are at a normal weight and have a low risk for diabetes, have this test once every three years after 57 years of age. ? If you are overweight and have a high risk for diabetes, consider being tested at a younger age or more often. Preventing infection Hepatitis B  If you have a higher risk for hepatitis B, you should be screened for this virus. You are considered at high risk for hepatitis B if: ? You were born in a country where hepatitis B is common. Ask your health care provider which countries are considered high risk. ? Your parents were born in a high-risk country, and you have not been immunized against hepatitis B (hepatitis B vaccine). ? You have HIV or AIDS. ? You use needles to inject street drugs. ? You live with someone who has hepatitis B. ? You have had sex with someone who has hepatitis B. ? You get hemodialysis treatment. ? You take certain medicines for conditions, including cancer, organ transplantation, and autoimmune conditions.  Hepatitis C  Blood testing is recommended for: ? Everyone born from [birth year] through 1965. ? Anyone with known risk factors for hepatitis C.  Sexually transmitted infections (STIs)  You should be screened for sexually transmitted infections (STIs) including gonorrhea and chlamydia if: ? You are sexually active and are younger than 57 years of age. ? You are older than 57 years of age and your health care provider tells you that you are at risk for this type of infection. ? Your sexual activity has changed since you were last screened and you are at an increased risk for chlamydia or gonorrhea. Ask your health care provider if you are at risk.  If you do not have HIV, but are at risk, it may be recommended that you take a prescription medicine daily to prevent HIV infection. This is called pre-exposure prophylaxis (PrEP). You are considered at risk if: ? You are sexually active and  do not regularly use condoms or know the HIV status of your partner(s). ? You take drugs by injection. ? You are sexually active with a partner who has HIV.  Talk with your health care provider about whether you are at high risk of being infected with HIV. If you choose to begin PrEP, you should first be tested for HIV. You should then be tested every 3 months for as  long as you are taking PrEP. Pregnancy  If you are premenopausal and you may become pregnant, ask your health care provider about preconception counseling.  If you may become pregnant, take 400 to 800 micrograms (mcg) of folic acid every day.  If you want to prevent pregnancy, talk to your health care provider about birth control (contraception). Osteoporosis and menopause  Osteoporosis is a disease in which the bones lose minerals and strength with aging. This can result in serious bone fractures. Your risk for osteoporosis can be identified using a bone density scan.  If you are 59 years of age or older, or if you are at risk for osteoporosis and fractures, ask your health care provider if you should be screened.  Ask your health care provider whether you should take a calcium or vitamin D supplement to lower your risk for osteoporosis.  Menopause may have certain physical symptoms and risks.  Hormone replacement therapy may reduce some of these symptoms and risks. Talk to your health care provider about whether hormone replacement therapy is right for you. Follow these instructions at home:  Schedule regular health, dental, and eye exams.  Stay current with your immunizations.  Do not use any tobacco products including cigarettes, chewing tobacco, or electronic cigarettes.  If you are pregnant, do not drink alcohol.  If you are breastfeeding, limit how much and how often you drink alcohol.  Limit alcohol intake to no more than 1 drink per day for nonpregnant women. One drink equals 12 ounces of beer, 5 ounces of  wine, or 1 ounces of hard liquor.  Do not use street drugs.  Do not share needles.  Ask your health care provider for help if you need support or information about quitting drugs.  Tell your health care provider if you often feel depressed.  Tell your health care provider if you have ever been abused or do not feel safe at home. This information is not intended to replace advice given to you by your health care provider. Make sure you discuss any questions you have with your health care provider. Document Released: 02/18/2011 Document Revised: 01/11/2016 Document Reviewed: 05/09/2015 Elsevier Interactive Patient Education  [employer].

## 2018-02-27 NOTE — Assessment & Plan Note (Signed)
Talked to her about likely needing to stop phentermine as she is at limit for safe use. Given name of belviq and contrave to think about.

## 2018-02-27 NOTE — Progress Notes (Signed)
   Subjective:    Patient ID: Angela Garcia, female    DOB: [birth year], 57 y.o.   MRN: [id number]  HPI The patient is a 58 YO female coming in for follow up of blood pressure (having leg swelling, off amlodipine for some time due to taking care of husband who passed about 3-4 months ago). She is also having struggles with weight loss and is taking phentermine intermittently through weight loss clinic.   Review of Systems  Constitutional: Negative.   HENT: Negative.   Eyes: Negative.   Respiratory: Negative for cough, chest tightness and shortness of breath.   Cardiovascular: Positive for leg swelling. Negative for chest pain and palpitations.  Gastrointestinal: Negative for abdominal distention, abdominal pain, constipation, diarrhea, nausea and vomiting.  Musculoskeletal: Negative.   Skin: Negative.   Neurological: Negative.   Psychiatric/Behavioral: Negative.       Objective:   Physical Exam  Constitutional: She is oriented to person, place, and time. She appears well-developed and well-nourished.  overweight  HENT:  Head: Normocephalic and atraumatic.  Eyes: EOM are normal.  Neck: Normal range of motion.  Cardiovascular: Normal rate and regular rhythm.  Pulmonary/Chest: Effort normal and breath sounds normal. No respiratory distress. She has no wheezes. She has no rales.  Abdominal: Soft. Bowel sounds are normal. She exhibits no distension. There is no tenderness. There is no rebound.  Musculoskeletal: She exhibits edema.  Neurological: She is alert and oriented to person, place, and time. Coordination normal.  Skin: Skin is warm and dry.  Psychiatric: She has a normal mood and affect.   Vitals:   02/27/18 1426  BP: 138/88  Pulse: 78  Temp: 97.8 F (36.6 C)  TempSrc: Oral  SpO2: 96%  Weight: 208 lb (94.3 kg)  Height: 5\' 2"  (1.575 m)      Assessment & Plan:

## 2018-04-16 ENCOUNTER — Other Ambulatory Visit: Payer: Self-pay

## 2018-04-16 DIAGNOSIS — D235 Other benign neoplasm of skin of trunk: Secondary | ICD-10-CM | POA: Diagnosis not present

## 2018-04-21 ENCOUNTER — Emergency Department
Admission: EM | Admit: 2018-04-21 | Discharge: 2018-04-21 | Disposition: A | Discharge status: Home / Self Care | Payer: 59 | Source: Home / Self Care | Attending: Family Medicine | Admitting: Family Medicine

## 2018-04-21 ENCOUNTER — Emergency Department (INDEPENDENT_AMBULATORY_CARE_PROVIDER_SITE_OTHER): Payer: 59

## 2018-04-21 ENCOUNTER — Other Ambulatory Visit: Payer: Self-pay

## 2018-04-21 DIAGNOSIS — M25649 Stiffness of unspecified hand, not elsewhere classified: Secondary | ICD-10-CM

## 2018-04-21 DIAGNOSIS — M79641 Pain in right hand: Secondary | ICD-10-CM | POA: Diagnosis not present

## 2018-04-21 NOTE — Discharge Instructions (Signed)
°  Please call to schedule a follow up appointment with Sports Medicine for further evaluation and treatment of your symptoms.

## 2018-04-21 NOTE — ED Triage Notes (Signed)
Pt has been having hand pain/ joint locking in the right hand mostly around the thumb.

## 2018-04-21 NOTE — ED Provider Notes (Signed)
[facility]    CSN: [id number] Arrival date & time: 04/21/18  1247     History   Chief Complaint Chief Complaint  Patient presents with  . Hand Pain    right    HPI Angela Garcia is a 57 y.o. female.   HPI  Angela Garcia is a 57 y.o. female presenting to UC with c/o 1 week of Right hand pain localized around her Right thumb with pain, swelling and stiffness. Pain is worse in the morning but gradually improves after moving her thumb throughout the day. Pain is aching and throbbing, moderate to severe. No relief with ibuprofen. Hx of trigger finger in both little fingers a few years ago but symptoms feel different. No known injury. She does work at a computer a lot during the day.    Past Medical History:  Diagnosis Date  . Fibroid   . Headache(784.0)    history - last one 4 months ago  . Hypertension   . PONV (postoperative nausea and vomiting)   . Prediabetes     Patient Active Problem List   Diagnosis Date Noted  . Hypertension   . Routine general medical examination at a health care facility 06/09/2015  . Obesity 11/02/2013  . History of irregular menstrual cycles 11/02/2013  . Postmenopausal bleeding 11/02/2013    Past Surgical History:  Procedure Laterality Date  . CATARACT EXTRACTION  05/2011   Bilateral  . CHOLECYSTECTOMY  2007  . COLONOSCOPY  01/21/14   Normal - Every 10 years  . CYST EXCISION Left 2002   Sebaceous removed from Breast - left benign  . EYE SURGERY    . HYSTEROSCOPY N/A 05/13/2014   Procedure: HYSTEROSCOPY with truclear  ;  Surgeon: Azalia Bilis, MD;  Location: [facility];  Service: Gynecology;  Laterality: N/A;    OB History    Gravida  0   Para  0   Term  0   Preterm  0   AB  0   Living  0     SAB  0   TAB  0   Ectopic  0   Multiple  0   Live Births               Home Medications    Prior to Admission medications   Medication Sig Start Date End Date Taking? Authorizing Provider  cetirizine  (ZYRTEC) 10 MG tablet Take 10 mg by mouth daily. Reported on 09/27/2015    [provider]  Cholecalciferol (VITAMIN D3) 2000 UNITS capsule Take 2,000 Units by mouth daily.    [provider]  furosemide (LASIX) 20 MG tablet Take 1 tablet (20 mg total) by mouth daily. 02/27/18   Hoyt Koch, MD  ibuprofen (ADVIL,MOTRIN) 400 MG tablet Take 400 mg by mouth every 6 (six) hours as needed.    [provider]  magnesium gluconate (MAGONATE) 30 MG tablet Take 30 mg by mouth 2 (two) times daily.    [provider]  MAGNESIUM PO Take 400 mg by mouth daily.     [provider]  metFORMIN (GLUCOPHAGE) 500 MG tablet Take 1 tablet (500 mg total) by mouth daily with breakfast. Need annual visit for further refills Patient not taking: Reported on 02/27/2018 09/22/17   Hoyt Koch, MD  Multiple Vitamins-Minerals (MULTIVITAMIN PO) Take 1 tablet by mouth.     [provider]  phentermine (ADIPEX-P) 37.5 MG tablet  07/17/16   [provider]  Family History Family History  Problem Relation Age of Onset  . Cancer Mother        Ovarian/Cevical?  . Lung cancer Mother   . Ovarian cancer Mother   . Lung cancer Father   . Heart attack Father 52  . Stroke Father        Late 61's  . Hypertension Father   . Ovarian cancer Sister   . Heart disease Sister   . Cancer Sister   . Lung cancer Maternal Uncle   . Heart attack Paternal Grandmother   . Congestive Heart Failure Paternal Grandmother     Social History Social History   Tobacco Use  . Smoking status: Never Smoker  . Smokeless tobacco: Never Used  Substance Use Topics  . Alcohol use: Yes    Alcohol/week: 1.0 - 2.0 standard drinks    Types: 1 - 2 Standard drinks or equivalent per week  . Drug use: No     Allergies   Clindamycin/lincomycin and Codeine   Review of Systems Review of Systems  Musculoskeletal: Positive for arthralgias, joint swelling and myalgias.    Skin: Negative for color change and wound.  Neurological: Positive for weakness ( Right thumb due to pain). Negative for numbness.     Physical Exam Triage Vital Signs ED Triage Vitals  Enc Vitals Group     BP 04/21/18 1309 (!) 162/91     Pulse Rate 04/21/18 1309 81     Resp --      Temp 04/21/18 1309 97.9 F (36.6 C)     Temp Source 04/21/18 1309 Oral     SpO2 04/21/18 1309 98 %     Weight 04/21/18 1310 205 lb (93 kg)     Height 04/21/18 1310 5' 2.5" (1.588 m)     Head Circumference --      Peak Flow --      Pain Score 04/21/18 1310 5     Pain Loc --      Pain Edu? --      Excl. in [place]? --    No data found.  Updated Vital Signs BP (!) 162/91 (BP Location: Right Arm)   Pulse 81   Temp 97.9 F (36.6 C) (Oral)   Ht 5' 2.5" (1.588 m)   Wt 205 lb (93 kg)   LMP 10/04/2013   SpO2 98%   BMI 36.90 kg/m   Visual Acuity Right Eye Distance:   Left Eye Distance:   Bilateral Distance:    Right Eye Near:   Left Eye Near:    Bilateral Near:     Physical Exam  Constitutional: She is oriented to person, place, and time. She appears well-developed and well-nourished. No distress.  HENT:  Head: Normocephalic and atraumatic.  Eyes: EOM are normal.  Neck: Normal range of motion.  Cardiovascular: Normal rate.  Pulmonary/Chest: Effort normal.  Musculoskeletal: She exhibits edema and tenderness.  Right thumb: moderate edema. Diffuse tenderness.  Decreased ROM due to swelling and pain. Full ROM 2nd-5th fingers. No tenderness to those fingers.  Full ROM Right wrist w/o tenderness.  Neurological: She is alert and oriented to person, place, and time.  Skin: Skin is warm and dry. Capillary refill takes less than 2 seconds. She is not diaphoretic.  Right thumb: skin in tact. No ecchymosis or erythema.   Psychiatric: She has a normal mood and affect. Her behavior is normal.  Nursing note and vitals reviewed.    UC Treatments / Results  Labs (all labs  ordered are listed, but  only abnormal results are displayed) Labs Reviewed - No data to display  EKG None  Radiology Dg Hand Complete Right  Result Date: 04/21/2018 CLINICAL DATA:  Right hand pain and swelling. EXAM: RIGHT HAND - COMPLETE 3+ VIEW COMPARISON:  No recent prior. FINDINGS: No acute bony or joint abnormality identified. No evidence of fracture or dislocation. No radiopaque foreign body. IMPRESSION: No acute abnormality. Electronically Signed   By: Marcello Moores  Register   On: 04/21/2018 13:50    Procedures Procedures (including critical care time)  Medications Ordered in UC Medications - No data to display  Initial Impression / Assessment and Plan / UC Course  I have reviewed the triage vital signs and the nursing notes.  Pertinent labs & imaging results that were available during my care of the patient were reviewed by me and considered in my medical decision making (see chart for details).     Discussed imaging with pt. Will tx as overuse injury.  Thumb splint provided for comfort Encouraged f/u with Sports Medicine for further evaluation and treatment of pain.   Final Clinical Impressions(s) / UC Diagnoses   Final diagnoses:  Right hand pain  Thumb joint stiffness     Discharge Instructions      Please call to schedule a follow up appointment with Sports Medicine for further evaluation and treatment of your symptoms.     ED Prescriptions    None     Controlled Substance Prescriptions [place] Controlled Substance Registry consulted? Not Applicable   Tyrell Antonio 04/21/18 1646

## 2018-04-23 ENCOUNTER — Ambulatory Visit (INDEPENDENT_AMBULATORY_CARE_PROVIDER_SITE_OTHER): Payer: 59 | Admitting: Family Medicine

## 2018-04-23 ENCOUNTER — Encounter: Payer: Self-pay | Admitting: Family Medicine

## 2018-04-23 VITALS — BP 135/85 | HR 86 | Wt 206.0 lb

## 2018-04-23 DIAGNOSIS — Z23 Encounter for immunization: Secondary | ICD-10-CM

## 2018-04-23 DIAGNOSIS — M79641 Pain in right hand: Secondary | ICD-10-CM

## 2018-04-23 MED ORDER — DICLOFENAC SODIUM 1 % TD GEL: 2.0000 g | Freq: Four times a day (QID) | TRANSDERMAL | 11 refills | Status: DC

## 2018-04-23 NOTE — Progress Notes (Signed)
Subjective:    CC: Trigger thumb  HPI: Angela "Tammie" has a one-week history of pain and triggering of her right thumb.  She notes that she is been increasing her activity a bit more recently.  She denies any injury.  She notes popping with motion of her right thumb interphalangeal joint and pain at the palmar MCP.  She had similar symptoms with trigger finger of her fifth digit bilaterally.  Her right hand was treated with injection in her left hand resolved spontaneously.  Her fifth digit trigger finger occurred about 10 years ago.  She has been increasing her activity at home recently.  Her husband died about 6 months ago and she is having to do more for herself at home.  Patient has used a bulky over-the-counter thumb brace which is helped a bit.  Incidentally she does note some grief that slowly improving.  Past medical history, Surgical history, Family history not pertinant except as noted below, Social history, Allergies, and medications have been entered into the medical record, reviewed, and no changes needed.   Review of Systems: No headache, visual changes, nausea, vomiting, diarrhea, constipation, dizziness, abdominal pain, skin rash, fevers, chills, night sweats, weight loss, swollen lymph nodes, body aches, joint swelling, muscle aches, chest pain, shortness of breath, mood changes, visual or auditory hallucinations.   Objective:    Vitals:   04/23/18 1048  BP: 135/85  Pulse: 86   General: Well Developed, well nourished, and in no acute distress.  Neuro/Psych: Alert and oriented x3, extra-ocular muscles intact, able to move all 4 extremities, sensation grossly intact. Skin: Warm and dry, no rashes noted.  Respiratory: Not using accessory muscles, speaking in full sentences, trachea midline.  Cardiovascular: Pulses palpable, no extremity edema. Abdomen: Does not appear distended. MSK:  Right hand normal-appearing no deformity erythema or swelling. Nontender to  palpation. Normal motion but triggering present with flexion of interphalangeal joint. Capillary refill and sensation are intact distally.  Lab and Radiology Results No results found for this or any previous visit (from the past 72 hour(s)). Dg Hand Complete Right  Result Date: 04/21/2018 CLINICAL DATA:  Right hand pain and swelling. EXAM: RIGHT HAND - COMPLETE 3+ VIEW COMPARISON:  No recent prior. FINDINGS: No acute bony or joint abnormality identified. No evidence of fracture or dislocation. No radiopaque foreign body. IMPRESSION: No acute abnormality. Electronically Signed   By: Marcello Moores  Register   On: 04/21/2018 13:50  I personally (independently) visualized and performed the interpretation of the images attached in this note.   Impression and Recommendations:    Assessment and Plan: 57 y.o. female with right trigger thumb.  Patient has a history of having trigger finger previously that resolved spontaneously.  She is quite early into the course of symptoms and I am optimistic that she will be able to improve without aggressive management.  Plan for double Band-Aid splint at interphalangeal joint and diclofenac gel.  If not improving next step would be injection.  Recheck as needed.  Flu vaccine given today prior to discharge..   Orders Placed This Encounter  Procedures  . Flu Vaccine QUAD 6+ mos PF IM (Fluarix Quad PF)   Meds ordered this encounter  Medications  . diclofenac sodium (VOLTAREN) 1 % GEL    Sig: Apply 2 g topically 4 (four) times daily. To affected joint.    Dispense:  100 g    Refill:  11    Discussed warning signs or symptoms. Please see discharge instructions. Patient  expresses understanding.

## 2018-04-23 NOTE — Patient Instructions (Signed)
Thank you for coming in today. Take easy with the thumb.  Use the black splint and or the double bandaid splint for a few weeks.  Use the diclofenac gel up to 4x daily.  Recheck if not better.    Trigger Finger Trigger finger (stenosing tenosynovitis) is a condition that causes a finger to get stuck in a bent position. Each finger has a tough, cord-like tissue that connects muscle to bone (tendon), and each tendon is surrounded by a tunnel of tissue (tendon sheath). To move your finger, your tendon needs to slide freely through the sheath. Trigger finger happens when the tendon or the sheath thickens, making it difficult to move your finger. Trigger finger can affect any finger or a thumb. It may affect more than one finger. Mild cases may clear up with rest and medicine. Severe cases require more treatment. What are the causes? Trigger finger is caused by a thickened finger tendon or tendon sheath. The cause of this thickening is not known. What increases the risk? The following factors may make you more likely to develop this condition:  Doing activities that require a strong grip.  Having rheumatoid arthritis, gout, or diabetes.  Being 64-66 years old.  Being a woman.  What are the signs or symptoms? Symptoms of this condition include:  Pain when bending or straightening your finger.  Tenderness or swelling where your finger attaches to the palm of your hand.  A lump in the palm of your hand or on the inside of your finger.  Hearing a popping sound when you try to straighten your finger.  Feeling a popping, catching, or locking sensation when you try to straighten your finger.  Being unable to straighten your finger.  How is this diagnosed? This condition is diagnosed based on your symptoms and a physical exam. How is this treated? This condition may be treated by:  Resting your finger and avoiding activities that make symptoms worse.  Wearing a finger splint to keep  your finger in a slightly bent position.  Taking NSAIDs to relieve pain and swelling.  Injecting medicine (steroids) into the tendon sheath to reduce swelling and irritation. Injections may need to be repeated.  Having surgery to open the tendon sheath. This may be done if other treatments do not work and you cannot straighten your finger. You may need physical therapy after surgery.  Follow these instructions at home:  Use moist heat to help reduce pain and swelling as told by your health care provider.  Rest your finger and avoid activities that make pain worse. Return to normal activities as told by your health care provider.  If you have a splint, wear it as told by your health care provider.  Take over-the-counter and prescription medicines only as told by your health care provider.  Keep all follow-up visits as told by your health care provider. This is important. Contact a health care provider if:  Your symptoms are not improving with home care. Summary  Trigger finger (stenosing tenosynovitis) causes your finger to get stuck in a bent position, and it can make it difficult and painful to straighten your finger.  This condition develops when a finger tendon or tendon sheath thickens.  Treatment starts with resting, wearing a splint, and taking NSAIDs.  In severe cases, surgery to open the tendon sheath may be needed. This information is not intended to replace advice given to you by your health care provider. Make sure you discuss any questions you have with  your health care provider. Document Released: 05/25/2004 Document Revised: 07/16/2016 Document Reviewed: 07/16/2016 Elsevier Interactive Patient Education  2017 [employer].

## 2018-06-01 ENCOUNTER — Emergency Department
Admission: EM | Admit: 2018-06-01 | Discharge: 2018-06-01 | Disposition: A | Discharge status: Home / Self Care | Payer: 59 | Source: Home / Self Care | Attending: Family Medicine | Admitting: Family Medicine

## 2018-06-01 ENCOUNTER — Encounter: Payer: Self-pay | Admitting: Emergency Medicine

## 2018-06-01 ENCOUNTER — Emergency Department (INDEPENDENT_AMBULATORY_CARE_PROVIDER_SITE_OTHER): Payer: 59

## 2018-06-01 DIAGNOSIS — R590 Localized enlarged lymph nodes: Secondary | ICD-10-CM | POA: Diagnosis not present

## 2018-06-01 DIAGNOSIS — K5732 Diverticulitis of large intestine without perforation or abscess without bleeding: Secondary | ICD-10-CM

## 2018-06-01 DIAGNOSIS — R1030 Lower abdominal pain, unspecified: Secondary | ICD-10-CM | POA: Diagnosis not present

## 2018-06-01 LAB — COMPLETE METABOLIC PANEL WITH GFR
AG Ratio: 1.6 (calc) (ref 1.0–2.5)
ALT: 19 U/L (ref 6–29)
AST: 21 U/L (ref 10–35)
Albumin: 4.2 g/dL (ref 3.6–5.1)
Alkaline phosphatase (APISO): 107 U/L (ref 33–130)
BUN: 8 mg/dL (ref 7–25)
CO2: 24 mmol/L (ref 20–32)
Calcium: 9 mg/dL (ref 8.6–10.4)
Chloride: 102 mmol/L (ref 98–110)
Creat: 0.56 mg/dL (ref 0.50–1.05)
GFR, Est African American: 121 mL/min/{1.73_m2} (ref >=60)
GFR, Est Non African American: 104 mL/min/{1.73_m2} (ref >=60)
Globulin: 2.7 g/dL (calc) (ref 1.9–3.7)
Glucose, Bld: 120 mg/dL — ABNORMAL HIGH (ref 65–99)
Potassium: 4.2 mmol/L (ref 3.5–5.3)
Sodium: 138 mmol/L (ref 135–146)
Total Bilirubin: 0.9 mg/dL (ref 0.2–1.2)
Total Protein: 6.9 g/dL (ref 6.1–8.1)

## 2018-06-01 LAB — POCT CBC W AUTO DIFF (K'VILLE URGENT CARE)

## 2018-06-01 MED ORDER — ONDANSETRON 4 MG PO TBDP: 4.0000 mg | ORAL_TABLET | Freq: Four times a day (QID) | ORAL | 0 refills | Status: DC | PRN

## 2018-06-01 MED ORDER — METRONIDAZOLE 500 MG PO TABS: 500.0000 mg | ORAL_TABLET | Freq: Three times a day (TID) | ORAL | 0 refills | Status: DC

## 2018-06-01 MED ORDER — CIPROFLOXACIN HCL 500 MG PO TABS: 500.0000 mg | ORAL_TABLET | Freq: Two times a day (BID) | ORAL | 0 refills | Status: DC

## 2018-06-01 MED ORDER — IOPAMIDOL (ISOVUE-300) INJECTION 61%
100.0000 mL | Freq: Once | INTRAVENOUS | Status: AC | PRN
  Administered 2018-06-01: 100 mL via INTRAVENOUS

## 2018-06-01 NOTE — ED Provider Notes (Signed)
[facility]    CSN: [id number] Arrival date & time: 06/01/18  0850     History   Chief Complaint Chief Complaint  Patient presents with  . Abdominal Pain    HPI Simmone Cape is a 57 y.o. female.   HPI Eun Vermeer is a 57 y.o. female presenting to UC with c/o sharp intermittent lower abdominal pain that started yesterday.  She has had abnormal light colored BMs and mild nausea but no vomiting or diarrhea. Denies fever or chills. Hx of suspected diverticulitis several years ago. Last colonoscopy was in 2015, she was advised she did not need another one for 10 years.    Past Medical History:  Diagnosis Date  . Fibroid   . Headache(784.0)    history - last one 4 months ago  . Hypertension   . PONV (postoperative nausea and vomiting)   . Prediabetes     Patient Active Problem List   Diagnosis Date Noted  . Hypertension   . Routine general medical examination at a health care facility 06/09/2015  . Obesity 11/02/2013  . History of irregular menstrual cycles 11/02/2013  . Postmenopausal bleeding 11/02/2013    Past Surgical History:  Procedure Laterality Date  . CATARACT EXTRACTION  05/2011   Bilateral  . CHOLECYSTECTOMY  2007  . COLONOSCOPY  01/21/14   Normal - Every 10 years  . CYST EXCISION Left 2002   Sebaceous removed from Breast - left benign  . EYE SURGERY    . HYSTEROSCOPY N/A 05/13/2014   Procedure: HYSTEROSCOPY with truclear  ;  Surgeon: Azalia Bilis, MD;  Location: [facility];  Service: Gynecology;  Laterality: N/A;    OB History    Gravida  0   Para  0   Term  0   Preterm  0   AB  0   Living  0     SAB  0   TAB  0   Ectopic  0   Multiple  0   Live Births               Home Medications    Prior to Admission medications   Medication Sig Start Date End Date Taking? Authorizing Provider  cetirizine (ZYRTEC) 10 MG tablet Take 10 mg by mouth daily. Reported on 09/27/2015    [provider]  Cholecalciferol  (VITAMIN D3) 2000 UNITS capsule Take 2,000 Units by mouth daily.    [provider]  ciprofloxacin (CIPRO) 500 MG tablet Take 1 tablet (500 mg total) by mouth 2 (two) times daily. One po bid x 7 days 06/01/18   Noe Gens, PA-C  diclofenac sodium (VOLTAREN) 1 % GEL Apply 2 g topically 4 (four) times daily. To affected joint. 04/23/18   Gregor Hams, MD  furosemide (LASIX) 20 MG tablet Take 1 tablet (20 mg total) by mouth daily. 02/27/18   Hoyt Koch, MD  ibuprofen (ADVIL,MOTRIN) 400 MG tablet Take 400 mg by mouth every 6 (six) hours as needed.    [provider]  magnesium gluconate (MAGONATE) 30 MG tablet Take 30 mg by mouth 2 (two) times daily.    [provider]  MAGNESIUM PO Take 400 mg by mouth daily.     [provider]  metroNIDAZOLE (FLAGYL) 500 MG tablet Take 1 tablet (500 mg total) by mouth 3 (three) times daily. 06/01/18   Noe Gens, PA-C  Multiple Vitamins-Minerals (MULTIVITAMIN PO) Take 1 tablet by mouth.  [provider]  ondansetron (ZOFRAN ODT) 4 MG disintegrating tablet Take 1 tablet (4 mg total) by mouth every 6 (six) hours as needed for nausea or vomiting. 06/01/18   Noe Gens, PA-C  phentermine (ADIPEX-P) 37.5 MG tablet  07/17/16   [provider]    Family History Family History  Problem Relation Age of Onset  . Cancer Mother        Ovarian/Cevical?  . Lung cancer Mother   . Ovarian cancer Mother   . Lung cancer Father   . Heart attack Father 34  . Stroke Father        Late 26's  . Hypertension Father   . Ovarian cancer Sister   . Heart disease Sister   . Cancer Sister   . Lung cancer Maternal Uncle   . Heart attack Paternal Grandmother   . Congestive Heart Failure Paternal Grandmother     Social History Social History   Tobacco Use  . Smoking status: Never Smoker  . Smokeless tobacco: Never Used  Substance Use Topics  . Alcohol use: Yes    Alcohol/week: 1.0 - 2.0 standard  drinks    Types: 1 - 2 Standard drinks or equivalent per week  . Drug use: No     Allergies   Clindamycin/lincomycin and Codeine   Review of Systems Review of Systems  Constitutional: Positive for appetite change (decreased). Negative for chills and fever.  HENT: Negative for congestion, ear pain, sore throat, trouble swallowing and voice change.   Respiratory: Negative for cough and shortness of breath.   Cardiovascular: Negative for chest pain and palpitations.  Gastrointestinal: Positive for abdominal pain and nausea. Negative for diarrhea and vomiting.  Genitourinary: Negative for dysuria, flank pain, frequency and hematuria.  Musculoskeletal: Negative for arthralgias, back pain and myalgias.  Skin: Negative for rash.  All other systems reviewed and are negative.    Physical Exam Triage Vital Signs ED Triage Vitals  Enc Vitals Group     BP 06/01/18 1020 138/82     Pulse Rate 06/01/18 1020 74     Resp --      Temp 06/01/18 1020 97.8 F (36.6 C)     Temp Source 06/01/18 1020 Oral     SpO2 06/01/18 1020 99 %     Weight 06/01/18 1024 202 lb (91.6 kg)     Height --      Head Circumference --      Peak Flow --      Pain Score 06/01/18 1023 2     Pain Loc --      Pain Edu? --      Excl. in [place]? --    No data found.  Updated Vital Signs BP 138/82 (BP Location: Right Arm)   Pulse 74   Temp 97.8 F (36.6 C) (Oral)   Wt 202 lb (91.6 kg)   LMP 10/04/2013   SpO2 99%   BMI 36.36 kg/m   Visual Acuity Right Eye Distance:   Left Eye Distance:   Bilateral Distance:    Right Eye Near:   Left Eye Near:    Bilateral Near:     Physical Exam  Constitutional: She is oriented to person, place, and time. She appears well-developed and well-nourished.  Non-toxic appearance. She does not appear ill. No distress.  HENT:  Head: Normocephalic and atraumatic.  Eyes: EOM are normal.  Neck: Normal range of motion.  Cardiovascular: Normal rate and regular rhythm.    Pulmonary/Chest: Effort normal  and breath sounds normal.  Abdominal: Soft. Normal appearance. There is tenderness in the right lower quadrant, suprapubic area and left lower quadrant. There is rebound and guarding. There is no rigidity, no CVA tenderness, no tenderness at McBurney's point and negative Murphy's sign.  Musculoskeletal: Normal range of motion.  Neurological: She is alert and oriented to person, place, and time.  Skin: Skin is warm and dry.  Psychiatric: She has a normal mood and affect. Her behavior is normal.  Nursing note and vitals reviewed.    UC Treatments / Results  Labs (all labs ordered are listed, but only abnormal results are displayed) Labs Reviewed  COMPLETE METABOLIC PANEL WITH GFR  POCT CBC W AUTO DIFF ([facility])    EKG None  Radiology Ct Abdomen Pelvis W Contrast  Result Date: 06/01/2018 CLINICAL DATA:  Acute generalized abdominal pain. EXAM: CT ABDOMEN AND PELVIS WITH CONTRAST TECHNIQUE: Multidetector CT imaging of the abdomen and pelvis was performed using the standard protocol following bolus administration of intravenous contrast. CONTRAST:  154mL ISOVUE-300 IOPAMIDOL (ISOVUE-300) INJECTION 61% COMPARISON:  None. FINDINGS: Lower chest: No acute abnormality. Hepatobiliary: No focal liver abnormality is seen. Status post cholecystectomy. No biliary dilatation. Pancreas: Unremarkable. No pancreatic ductal dilatation or surrounding inflammatory changes. Spleen: Normal in size without focal abnormality. Adrenals/Urinary Tract: Adrenal glands are unremarkable. Kidneys are normal, without renal calculi, focal lesion, or hydronephrosis. Bladder is unremarkable. Stomach/Bowel: The stomach and appendix appear normal. Acute sigmoid diverticulitis is noted without abscess formation. There is no evidence of bowel obstruction. Vascular/Lymphatic: No vascular abnormality is noted. Left periaortic adenopathy is noted with largest lymph node measuring 1.2 cm.  This may simply be inflammatory in etiology, but neoplasm cannot be excluded. Clinical correlation is recommended. Reproductive: Uterus and bilateral adnexa are unremarkable. Other: No abdominal wall hernia or abnormality. No abdominopelvic ascites. Musculoskeletal: No acute or significant osseous findings. IMPRESSION: Acute sigmoid diverticulitis without abscess formation. Mildly enlarged left periaortic adenopathy is noted which most likely is inflammatory in etiology, but clinical correlation is recommended to rule out neoplasm. Electronically Signed   By: Marijo Conception, M.D.   On: 06/01/2018 13:45    Procedures Procedures (including critical care time)  Medications Ordered in UC Medications - No data to display  Initial Impression / Assessment and Plan / UC Course  I have reviewed the triage vital signs and the nursing notes.  Pertinent labs & imaging results that were available during my care of the patient were reviewed by me and considered in my medical decision making (see chart for details).     Abdominal exam concerning for appendicitis vs diverticulitis. CBC: elevated WBC- 15  CT abd: significant for acute sigmoid diverticulitis w/o abscess Discussed imaging with pt Will start on Cipro and Flagyl. Zofran sent in to take as needed. Encouraged f/u with PCP Discussed symptoms that warrant emergent care in the ED.  Final Clinical Impressions(s) / UC Diagnoses   Final diagnoses:  Lower abdominal pain  Sigmoid diverticulitis     Discharge Instructions      Please see this packet for home diet to help with your diverticulitis symptoms.  Please take antibiotics as prescribed and be sure to complete entire course even if you start to feel better to ensure infection does not come back.  Please follow up with your family doctor for recheck of symptoms this week. They will likely recommend a follow up colonoscopy.  If symptoms worsen- worsening pain, unable to keep down  fluids, please call  [phone] or go to the emergency department.     ED Prescriptions    Medication Sig Dispense Auth. Provider   ciprofloxacin (CIPRO) 500 MG tablet  (Status: Discontinued) Take 1 tablet (500 mg total) by mouth 2 (two) times daily. One po bid x 7 days 14 tablet Karel Mowers O, PA-C   metroNIDAZOLE (FLAGYL) 500 MG tablet Take 1 tablet (500 mg total) by mouth 3 (three) times daily. 21 tablet Gerarda Fraction, Aalaya Yadao O, PA-C   ciprofloxacin (CIPRO) 500 MG tablet Take 1 tablet (500 mg total) by mouth 2 (two) times daily. One po bid x 7 days 14 tablet Shaquile Lutze O, PA-C   ondansetron (ZOFRAN ODT) 4 MG disintegrating tablet Take 1 tablet (4 mg total) by mouth every 6 (six) hours as needed for nausea or vomiting. 12 tablet Noe Gens, PA-C     Controlled Substance Prescriptions [place] Controlled Substance Registry consulted? Not Applicable   Tyrell Antonio 06/01/18 1448

## 2018-06-01 NOTE — ED Notes (Signed)
Bed: K[facility]3 Expected date:  Expected time:  Means of arrival:  Comments:

## 2018-06-01 NOTE — Discharge Instructions (Signed)
°  Please see this packet for home diet to help with your diverticulitis symptoms.  Please take antibiotics as prescribed and be sure to complete entire course even if you start to feel better to ensure infection does not come back.  Please follow up with your family doctor for recheck of symptoms this week. They will likely recommend a follow up colonoscopy.  If symptoms worsen- worsening pain, unable to keep down fluids, please call 911 or go to the emergency department.

## 2018-06-01 NOTE — ED Triage Notes (Signed)
Pt c/o sharp intermittent abdominal pain that started yesterday. States she has had abnormal BM but denies N+V+D.

## 2018-06-03 ENCOUNTER — Telehealth: Payer: Self-pay | Admitting: Emergency Medicine

## 2018-06-03 NOTE — Telephone Encounter (Signed)
    Based on the phone message, if she is not having any new systemic symptoms and not having any problems breathing or swallowing, the rash is more likely from poison ivy. -I would advise OTC Benadryl by mouth, and may try OTC hydrocortisone cream topically for the rash.-But needs follow-up office visit here or with PCP if rash does not improve or if she develops new symptoms.    Read above response to patient; she understands and will comply.

## 2018-06-03 NOTE — Telephone Encounter (Signed)
Patient calling to report rash developing on both arms; she is on Cipro and Flagyl, which she has taken before without problem. Wonders if it could be from poison ivy carried in by her puppy; wanting to know if ok to take benadryl. I will consult with Dr.Massey about discontinuing rxs, which patient does not want to do because they are helping her abdominal pain.

## 2018-07-23 DIAGNOSIS — D229 Melanocytic nevi, unspecified: Secondary | ICD-10-CM | POA: Diagnosis not present

## 2018-09-03 ENCOUNTER — Ambulatory Visit: Payer: 59 | Admitting: Obstetrics and Gynecology

## 2018-12-10 ENCOUNTER — Ambulatory Visit: Payer: Self-pay | Admitting: Obstetrics and Gynecology

## 2019-01-06 ENCOUNTER — Other Ambulatory Visit: Payer: Self-pay | Admitting: Obstetrics and Gynecology

## 2019-01-06 DIAGNOSIS — Z1231 Encounter for screening mammogram for malignant neoplasm of breast: Secondary | ICD-10-CM

## 2019-01-21 DIAGNOSIS — D229 Melanocytic nevi, unspecified: Secondary | ICD-10-CM | POA: Diagnosis not present

## 2019-01-25 NOTE — Progress Notes (Signed)
58 y.o. G0P0000 Widowed White or Caucasian Not Hispanic or Latino female here for annual exam.   Husband died in 12-09-17 of metastatic colon cancer. She was married x 21 years. Is is staring to feel like herself. She is working a lot. She has a year old [place].  No vaginal bleeding. Not sure about dating, had dinner with a high school friend last weekend.     Patient's last menstrual period was 10/04/2013.          Sexually active: No.  The current method of family planning is post menopausal status.    Exercising: Yes.    walking Smoker:  no  Health Maintenance: Pap: 08/22/2017 WNL NEG HPV,  12-14-13 WNL  History of abnormal Pap:  no MMG:  09/01/2017 Birads 1 negative Colonoscopy:  01-21-14 WNL repeat in 10 years BMD:   Never TDaP:  Up to date Gardasil: N/A   reports that she has never smoked. She has never used smokeless tobacco. She reports previous alcohol use. She reports that she does not use drugs. Working on weight loss, cut out ETOH to help with weight loss and to be healthy. She has lost 33 lbs since January. Works in [employer].   Past Medical History:  Diagnosis Date  . Fibroid   . Headache(784.0)    history - last one 4 months ago  . Hypertension   . PONV (postoperative nausea and vomiting)   . Prediabetes     Past Surgical History:  Procedure Laterality Date  . CATARACT EXTRACTION  05/2011   Bilateral  . CHOLECYSTECTOMY  12/09/2005  . COLONOSCOPY  01/21/14   Normal - Every 10 years  . CYST EXCISION Left December 09, 2000   Sebaceous removed from Breast - left benign  . EYE SURGERY    . HYSTEROSCOPY N/A 05/13/2014   Procedure: HYSTEROSCOPY with truclear  ;  Surgeon: Azalia Bilis, MD;  Location: [facility];  Service: Gynecology;  Laterality: N/A;    Current Outpatient Medications  Medication Sig Dispense Refill  . cetirizine (ZYRTEC) 10 MG tablet Take 10 mg by mouth daily. Reported on 09/27/2015    . Cholecalciferol (VITAMIN D3) 2000 UNITS capsule Take 2,000 Units by  mouth daily.    Marland Kitchen MAGNESIUM PO Take 400 mg by mouth daily.     . Multiple Vitamins-Minerals (MULTIVITAMIN PO) Take 1 tablet by mouth.     . phentermine (ADIPEX-P) 37.5 MG tablet   0   No current facility-administered medications for this visit.     Family History  Problem Relation Age of Onset  . Cancer Mother        Ovarian/Cevical?  . Lung cancer Mother   . Ovarian cancer Mother   . Lung cancer Father   . Heart attack Father 12  . Stroke Father        Late 60's  . Hypertension Father   . Ovarian cancer Sister   . Heart disease Sister   . Cancer Sister   . Lung cancer Maternal Uncle   . Heart attack Paternal Grandmother   . Congestive Heart Failure Paternal Grandmother     Review of Systems  Constitutional: Negative.   HENT: Negative.   Eyes: Negative.   Respiratory: Negative.   Cardiovascular: Negative.   Gastrointestinal: Negative.   Endocrine: Negative.   Genitourinary: Negative.   Musculoskeletal: Negative.   Skin: Negative.   Allergic/Immunologic: Negative.   Neurological: Negative.   Hematological: Negative.   Psychiatric/Behavioral: Negative.  Exam:   BP 128/82 (BP Location: Left Arm, Patient Position: Sitting, Cuff Size: Normal)   Pulse 80   Temp 97.9 F (36.6 C) (Skin)   Ht 5\' 2"  (1.575 m)   Wt 177 lb 3.2 oz (80.4 kg)   LMP 10/04/2013   BMI 32.41 kg/m   Weight change: @WEIGHTCHANGE @ Height:   Height: 5\' 2"  (157.5 cm)  Ht Readings from Last 3 Encounters:  01/27/19 5\' 2"  (1.575 m)  04/21/18 5' 2.5" (1.588 m)  02/27/18 5\' 2"  (1.575 m)    General appearance: alert, cooperative and appears stated age Head: Normocephalic, without obvious abnormality, atraumatic Neck: no adenopathy, supple, symmetrical, trachea midline and thyroid normal to inspection and palpation Lungs: clear to auscultation bilaterally Cardiovascular: regular rate and rhythm Breasts: normal appearance, no masses or tenderness Abdomen: soft, non-tender; non distended,  no  masses,  no organomegaly Extremities: extremities normal, atraumatic, no cyanosis or edema Skin: Skin color, texture, turgor normal. No rashes or lesions Lymph nodes: Cervical, supraclavicular, and axillary nodes normal. No abnormal inguinal nodes palpated Neurologic: Grossly normal   Pelvic: External genitalia:  no lesions              Urethra:  normal appearing urethra with no masses, tenderness or lesions              Bartholins and Skenes: normal                 Vagina: normal appearing vagina with normal color and discharge, no lesions              Cervix: no lesions               Bimanual Exam:  Uterus:  normal size, contour, position, consistency, mobility, non-tender              Adnexa: no mass, fullness, tenderness               Rectovaginal: Confirms               Anus:  normal sphincter tone, no lesions  Chaperone was present for exam.  A:  Well Woman with normal exam  P:   No pap this year  Labs with primary  Mammogram overdue, scheduled   Colonoscopy UTD  Discussed breast self exam  Discussed calcium and vit D intake

## 2019-01-27 ENCOUNTER — Encounter: Payer: Self-pay | Admitting: Obstetrics and Gynecology

## 2019-01-27 ENCOUNTER — Ambulatory Visit: Payer: 59 | Admitting: Obstetrics and Gynecology

## 2019-01-27 ENCOUNTER — Other Ambulatory Visit: Payer: Self-pay

## 2019-01-27 VITALS — BP 128/82 | HR 80 | Temp 97.9°F | Ht 62.0 in | Wt 177.2 lb

## 2019-01-27 DIAGNOSIS — Z01419 Encounter for gynecological examination (general) (routine) without abnormal findings: Secondary | ICD-10-CM | POA: Diagnosis not present

## 2019-01-27 NOTE — Patient Instructions (Signed)

## 2019-02-06 ENCOUNTER — Ambulatory Visit
Admission: RE | Admit: 2019-02-06 | Discharge: 2019-02-06 | Disposition: A | Discharge status: Home / Self Care | Payer: 59 | Source: Ambulatory Visit | Attending: Obstetrics and Gynecology | Admitting: Obstetrics and Gynecology

## 2019-02-06 ENCOUNTER — Other Ambulatory Visit: Payer: Self-pay

## 2019-02-06 DIAGNOSIS — Z1231 Encounter for screening mammogram for malignant neoplasm of breast: Secondary | ICD-10-CM | POA: Diagnosis not present

## 2019-03-09 ENCOUNTER — Ambulatory Visit (INDEPENDENT_AMBULATORY_CARE_PROVIDER_SITE_OTHER): Payer: 59 | Admitting: Osteopathic Medicine

## 2019-03-09 ENCOUNTER — Other Ambulatory Visit: Payer: Self-pay

## 2019-03-09 ENCOUNTER — Encounter: Payer: Self-pay | Admitting: Osteopathic Medicine

## 2019-03-09 VITALS — BP 136/71 | HR 77 | Temp 98.4°F | Ht 63.0 in | Wt 166.7 lb

## 2019-03-09 DIAGNOSIS — Z0000 Encounter for general adult medical examination without abnormal findings: Secondary | ICD-10-CM

## 2019-03-09 DIAGNOSIS — R7309 Other abnormal glucose: Secondary | ICD-10-CM

## 2019-03-09 NOTE — Patient Instructions (Signed)
General Preventive Care  Most recent routine screening lipids/other labs: ordered today.   Cholesterol and Diabetes screening usually recommended annually.   Thyroid and Vitamin D: routine screening is not medically necessary, therefore most insurance will not cover this test as part of "free labs" on your annual physical. If you desire this testing, you may be charged for it! Please check with the lab before your blood draw if you're concerned about cost.   Everyone should have blood pressure checked once per year.   Tobacco: don't!   Alcohol: responsible moderation is ok for most adults - if you have concerns about your alcohol intake, please talk to me!   Exercise: as tolerated to reduce risk of cardiovascular disease and diabetes. Strength training will also prevent osteoporosis.   Mental health: if need for mental health care (medicines, counseling, other), or concerns about moods, please let me know!   Sexual health: if ever need for STD testing, or if concerns with libido/pain problems, please let me or OBGYN know!  Advanced Directive: Living Will and/or Healthcare Power of Attorney recommended for all adults, regardless of age or health.  Vaccines  Flu vaccine: recommended for almost everyone, every fall.   Shingles vaccine: Shingrix recommended after age 53.   Pneumonia vaccines: Prevnar and Pneumovax recommended after age 7, or sooner if certain medical conditions.  Tetanus booster: Tdap recommended every 10 years.  Cancer screenings   Colon cancer screening: recommended for everyone at age 88.   Breast cancer screening: mammogram recommended annually after age 83.   Cervical cancer screening: Pap every 1 to 5 years depending on age and other risk factors. Can usually stop at age 7 or w/ hysterectomy.   Lung cancer screening: not needed for non-smokers  Infection screenings . HIV, Gonorrhea/Chlamydia: screening as needed. . . Hepatitis C: recommended for anyone  born 75-1965 . TB: certain at-risk populations, or depending on work requirements and/or travel history Other . Bone Density Test: recommended for women at age 76, sooner depending on risk factors

## 2019-03-09 NOTE — Progress Notes (Signed)
HPI: Angela Garcia is a 58 y.o. female who  has a past medical history of Fibroid, Headache(784.0), Hypertension, PONV (postoperative nausea and vomiting), and Prediabetes.  she presents to [facility] today, 03/09/19,  for chief complaint of: New to establish care Requests annual cholesterol, A1c, etc.  No other concerns today.    Patient here for annual physical / wellness exam.  See preventive care reviewed as below.   Additional concerns today include:  None, doing well!   Wellness: Follows with OB/GYN, Dr. Sumner Boast.  Most recent visit 01/27/2019. G0P0. Widow - husband of 21 years passed 10/2017 of colon cancer. Coping ok, has a boyfriend now. Postmenopausal, LMP 2015.  Mammogram was ordered by OB/GYN.  Colonoscopy reported up-to-date last done 5 years ago and was normal.       Past medical, surgical, social and family history reviewed:  Patient Active Problem List   Diagnosis Date Noted  . Hypertension   . Routine general medical examination at a health care facility 06/09/2015  . Obesity 11/02/2013  . History of irregular menstrual cycles 11/02/2013  . Postmenopausal bleeding 11/02/2013    Past Surgical History:  Procedure Laterality Date  . CATARACT EXTRACTION  05/2011   Bilateral  . CHOLECYSTECTOMY  2007  . COLONOSCOPY  01/21/14   Normal - Every 10 years  . CYST EXCISION Left 2002   Sebaceous removed from Breast - left benign  . EYE SURGERY    . HYSTEROSCOPY N/A 05/13/2014   Procedure: HYSTEROSCOPY with truclear  ;  Surgeon: Azalia Bilis, MD;  Location: [facility];  Service: Gynecology;  Laterality: N/A;    Social History   Tobacco Use  . Smoking status: Never Smoker  . Smokeless tobacco: Never Used  Substance Use Topics  . Alcohol use: Not Currently    Family History  Problem Relation Age of Onset  . Cancer Mother        Ovarian/Cevical?  . Lung cancer Mother   . Ovarian cancer Mother   . Lung cancer Father    . Heart attack Father 20  . Stroke Father        Late 21's  . Hypertension Father   . Ovarian cancer Sister   . Heart disease Sister   . Cancer Sister   . Lung cancer Maternal Uncle   . Heart attack Paternal Grandmother   . Congestive Heart Failure Paternal Grandmother      Current medication list and allergy/intolerance information reviewed:    Current Outpatient Medications  Medication Sig Dispense Refill  . cetirizine (ZYRTEC) 10 MG tablet Take 10 mg by mouth daily. Reported on 09/27/2015    . Cholecalciferol (VITAMIN D3) 2000 UNITS capsule Take 2,000 Units by mouth daily.    Marland Kitchen MAGNESIUM PO Take 400 mg by mouth daily.     . Multiple Vitamins-Minerals (MULTIVITAMIN PO) Take 1 tablet by mouth.     Marland Kitchen oxymetazoline (AFRIN 12 HOUR) 0.05 % nasal spray     . phentermine (ADIPEX-P) 37.5 MG tablet   0   No current facility-administered medications for this visit.     Allergies  Allergen Reactions  . Clindamycin/Lincomycin   . Codeine Nausea And Vomiting      Review of Systems:  Constitutional:  No  fever, no chills, No recent illness, No unintentional weight changes. No significant fatigue.   HEENT: No  headache, no vision change, no hearing change, No sore throat, No  sinus pressure  Cardiac: No  chest  pain, No  pressure, No palpitations, No  Orthopnea  Respiratory:  No  shortness of breath. No  Cough  Gastrointestinal: No  abdominal pain, No  nausea, No  vomiting,  No  blood in stool, No  diarrhea, No  constipation   Musculoskeletal: No new myalgia/arthralgia  Skin: No  Rash, No other wounds/concerning lesions  Genitourinary: No  incontinence, No  abnormal genital bleeding, No abnormal genital discharge  Hem/Onc: No  easy bruising/bleeding, No  abnormal lymph node  Endocrine: No cold intolerance,  No heat intolerance. No polyuria/polydipsia/polyphagia   Neurologic: No  weakness, No  dizziness, No  slurred speech/focal weakness/facial droop  Psychiatric: No   concerns with depression, No  concerns with anxiety, No sleep problems, No mood problems  Exam:  BP 136/71 (BP Location: Left Arm, Patient Position: Sitting, Cuff Size: Normal)   Pulse 77   Temp 98.4 F (36.9 C) (Oral)   Ht 5\' 3"  (1.6 m)   Wt 166 lb 11.2 oz (75.6 kg)   LMP 10/04/2013   BMI 29.53 kg/m   Constitutional: VS see above. General Appearance: alert, well-developed, well-nourished, NAD  Eyes: Normal lids and conjunctive, non-icteric sclera  Neck: No masses, trachea midline. No thyroid enlargement. No tenderness/mass appreciated. No lymphadenopathy  Respiratory: Normal respiratory effort. no wheeze, no rhonchi, no rales  Cardiovascular: S1/S2 normal, no murmur, no rub/gallop auscultated. RRR. No lower extremity edema.   Gastrointestinal: Nontender, no masses. No hepatomegaly, no splenomegaly. No hernia appreciated. Bowel sounds normal. Rectal exam deferred.   Musculoskeletal: Gait normal. No clubbing/cyanosis of digits.   Neurological: Normal balance/coordination. No tremor. No cranial nerve deficit on limited exam. Motor and sensation intact and symmetric. Cerebellar reflexes intact.   Skin: warm, dry, intact. No rash/ulcer. No concerning nevi or subq nodules on limited exam.    Psychiatric: Normal judgment/insight. Normal mood and affect. Oriented x3.    No results found for this or any previous visit (from the past 72 hour(s)).  No results found.   ASSESSMENT/PLAN: The primary encounter diagnosis was Annual physical exam. A diagnosis of Elevated hemoglobin A1c was also pertinent to this visit.   Orders Placed This Encounter  Procedures  . CBC  . COMPLETE METABOLIC PANEL WITH GFR  . Lipid panel  . VITAMIN D 25 Hydroxy (Vit-D Deficiency, Fractures)  . TSH  . Hemoglobin A1c       Patient Instructions  General Preventive Care  Most recent routine screening lipids/other labs: ordered today.   Cholesterol and Diabetes screening usually recommended  annually.   Thyroid and Vitamin D: routine screening is not medically necessary, therefore most insurance will not cover this test as part of "free labs" on your annual physical. If you desire this testing, you may be charged for it! Please check with the lab before your blood draw if you're concerned about cost.   Everyone should have blood pressure checked once per year.   Tobacco: don't!   Alcohol: responsible moderation is ok for most adults - if you have concerns about your alcohol intake, please talk to me!   Exercise: as tolerated to reduce risk of cardiovascular disease and diabetes. Strength training will also prevent osteoporosis.   Mental health: if need for mental health care (medicines, counseling, other), or concerns about moods, please let me know!   Sexual health: if ever need for STD testing, or if concerns with libido/pain problems, please let me or OBGYN know!  Advanced Directive: Living Will and/or Healthcare Power of Attorney recommended for all  adults, regardless of age or health.  Vaccines  Flu vaccine: recommended for almost everyone, every fall.   Shingles vaccine: Shingrix recommended after age 81.   Pneumonia vaccines: Prevnar and Pneumovax recommended after age 86, or sooner if certain medical conditions.  Tetanus booster: Tdap recommended every 10 years.  Cancer screenings   Colon cancer screening: recommended for everyone at age 44.   Breast cancer screening: mammogram recommended annually after age 76.   Cervical cancer screening: Pap every 1 to 5 years depending on age and other risk factors. Can usually stop at age 59 or w/ hysterectomy.   Lung cancer screening: not needed for non-smokers  Infection screenings . HIV, Gonorrhea/Chlamydia: screening as needed. . . Hepatitis C: recommended for anyone born 56-1965 . TB: certain at-risk populations, or depending on work requirements and/or travel history Other . Bone Density Test: recommended for  women at age 56, sooner depending on risk factors        Visit summary with medication list and pertinent instructions was printed for patient to review. All questions at time of visit were answered - patient instructed to contact office with any additional concerns or updates. ER/RTC precautions were reviewed with the patient.     Please note: voice recognition software was used to produce this document, and typos may escape review. Please contact Dr. Sheppard Coil for any needed clarifications.     Follow-up plan: Return for ANNUAL (call week prior to visit for lab orders).

## 2019-03-10 LAB — COMPLETE METABOLIC PANEL WITH GFR
AG Ratio: 1.8 (calc) (ref 1.0–2.5)
ALT: 28 U/L (ref 6–29)
AST: 24 U/L (ref 10–35)
Albumin: 4.4 g/dL (ref 3.6–5.1)
Alkaline phosphatase (APISO): 70 U/L (ref 37–153)
BUN: 15 mg/dL (ref 7–25)
CO2: 30 mmol/L (ref 20–32)
Calcium: 9.5 mg/dL (ref 8.6–10.4)
Chloride: 103 mmol/L (ref 98–110)
Creat: 0.62 mg/dL (ref 0.50–1.05)
GFR, Est African American: 116 mL/min/{1.73_m2} (ref >=60)
GFR, Est Non African American: 100 mL/min/{1.73_m2} (ref >=60)
Globulin: 2.4 g/dL (calc) (ref 1.9–3.7)
Glucose, Bld: 114 mg/dL — ABNORMAL HIGH (ref 65–99)
Potassium: 4.1 mmol/L (ref 3.5–5.3)
Sodium: 141 mmol/L (ref 135–146)
Total Bilirubin: 0.5 mg/dL (ref 0.2–1.2)
Total Protein: 6.8 g/dL (ref 6.1–8.1)

## 2019-03-10 LAB — CBC
HCT: 37.1 % (ref 35.0–45.0)
Hemoglobin: 12.1 g/dL (ref 11.7–15.5)
MCH: 28.8 pg (ref 27.0–33.0)
MCHC: 32.6 g/dL (ref 32.0–36.0)
MCV: 88.3 fL (ref 80.0–100.0)
MPV: 10.5 fL (ref 7.5–12.5)
Platelets: 445 10*3/uL — ABNORMAL HIGH (ref 140–400)
RBC: 4.2 10*6/uL (ref 3.80–5.10)
RDW: 14 % (ref 11.0–15.0)
WBC: 8.7 10*3/uL (ref 3.8–10.8)

## 2019-03-10 LAB — HEMOGLOBIN A1C
Hgb A1c MFr Bld: 5.9 % of total Hgb — ABNORMAL HIGH (ref <5.7)
Mean Plasma Glucose: 123 (calc)
eAG (mmol/L): 6.8 (calc)

## 2019-03-10 LAB — LIPID PANEL
Cholesterol: 204 mg/dL — ABNORMAL HIGH (ref <200)
HDL: 54 mg/dL (ref >=50)
LDL Cholesterol (Calc): 125 mg/dL (calc) — ABNORMAL HIGH
Non-HDL Cholesterol (Calc): 150 mg/dL (calc) — ABNORMAL HIGH (ref <130)
Total CHOL/HDL Ratio: 3.8 (calc) (ref <5.0)
Triglycerides: 131 mg/dL (ref <150)

## 2019-03-10 LAB — TSH: TSH: 2.29 mIU/L (ref 0.40–4.50)

## 2019-03-10 LAB — VITAMIN D 25 HYDROXY (VIT D DEFICIENCY, FRACTURES): Vit D, 25-Hydroxy: 86 ng/mL (ref 30–100)

## 2019-06-12 DIAGNOSIS — Z20828 Contact with and (suspected) exposure to other viral communicable diseases: Secondary | ICD-10-CM | POA: Diagnosis not present

## 2020-01-31 NOTE — Progress Notes (Addendum)
59 y.o. G0P0000 Married White or Caucasian Not Hispanic or Latino female here for annual exam. Patients state that she got Married in 07/01/2023. Her first husband died in 28-Nov-2017 of colon cancer. Discomfort with sex, if she uses lubrication she is fine. Very happy.  No vaginal bleeding. No bowel or bladder issues.     Patient's last menstrual period was 10/04/2013.          Sexually active: Yes.    The current method of family planning is post menopausal status.    Exercising: Yes.    [place]  Smoker:  no  Health Maintenance: Pap:  08/22/2017 WNL NEG HPV, 12-14-13 WNL History of abnormal Pap:  no MMG:  02/08/19 density B Bi-rads  1 neg  BMD:   Never  Colonoscopy: 01-21-14 WNL repeat in 10 years TDaP:  Up to date per patient    reports that she has never smoked. She has never used smokeless tobacco. She reports previous alcohol use. She reports that she does not use drugs. Has 1-2 glasses of wine a day. Living with her new husband on a farm, has a pool, horses, 4 german shepherds.    Past Medical History:  Diagnosis Date  . Fibroid   . Headache(784.0)    history - last one 4 months ago  . Hypertension   . PONV (postoperative nausea and vomiting)   . Prediabetes   BP has improved.   Past Surgical History:  Procedure Laterality Date  . CATARACT EXTRACTION  07-01-2011   Bilateral  . CHOLECYSTECTOMY  Nov 28, 2005  . COLONOSCOPY  01/21/14   Normal - Every 10 years  . CYST EXCISION Left November 28, 2000   Sebaceous removed from Breast - left benign  . EYE SURGERY    . HYSTEROSCOPY N/A 05/13/2014   Procedure: HYSTEROSCOPY with truclear  ;  Surgeon: Azalia Bilis, MD;  Location: [facility];  Service: Gynecology;  Laterality: N/A;    Current Outpatient Medications  Medication Sig Dispense Refill  . cetirizine (ZYRTEC) 10 MG tablet Take 10 mg by mouth daily. Reported on 09/27/2015    . Cholecalciferol (VITAMIN D3) 2000 UNITS capsule Take 2,000 Units by mouth daily.    Marland Kitchen MAGNESIUM PO Take 400 mg by mouth daily.      . Multiple Vitamins-Minerals (MULTIVITAMIN PO) Take 1 tablet by mouth.     Marland Kitchen oxymetazoline (AFRIN 12 HOUR) 0.05 % nasal spray     . phentermine (ADIPEX-P) 37.5 MG tablet   0   No current facility-administered medications for this visit.    Family History  Problem Relation Age of Onset  . Cancer Mother        Ovarian/Cevical?  . Lung cancer Mother   . Ovarian cancer Mother   . Lung cancer Father   . Heart attack Father 30  . Stroke Father        Late 25's  . Hypertension Father   . Ovarian cancer Sister   . Heart disease Sister   . Cancer Sister   . Lung cancer Maternal Uncle   . Heart attack Paternal Grandmother   . Congestive Heart Failure Paternal Grandmother     Review of Systems  All other systems reviewed and are negative.   Exam:   BP 138/72   Pulse 77   Temp 98.2 F (36.8 C)   Ht 5\' 2"  (1.575 m)   Wt 181 lb (82.1 kg)   LMP 10/04/2013   SpO2 97%   BMI 33.11 kg/m  Weight change: @WEIGHTCHANGE @ Height:   Height: 5\' 2"  (157.5 cm)  Ht Readings from Last 3 Encounters:  02/02/20 5\' 2"  (1.575 m)  03/09/19 5\' 3"  (1.6 m)  01/27/19 5\' 2"  (1.575 m)    General appearance: alert, cooperative and appears stated age Head: Normocephalic, without obvious abnormality, atraumatic Neck: no adenopathy, supple, symmetrical, trachea midline and thyroid normal to inspection and palpation Lungs: clear to auscultation bilaterally Cardiovascular: regular rate and rhythm Breasts: normal appearance, no masses or tenderness Abdomen: soft, non-tender; non distended,  no masses,  no organomegaly Extremities: extremities normal, atraumatic, no cyanosis or edema Skin: Skin color, texture, turgor normal. No rashes or lesions Lymph nodes: Cervical, supraclavicular, and axillary nodes normal. No abnormal inguinal nodes palpated Neurologic: Grossly normal   Pelvic: External genitalia:  no lesions              Urethra:  normal appearing urethra with no masses, tenderness or lesions               Bartholins and Skenes: normal                 Vagina: normal appearing vagina with normal color and discharge, no lesions              Cervix: no lesions               Bimanual Exam:  Uterus:  normal size, contour, position, consistency, mobility, non-tender              Adnexa: no mass, fullness, tenderness               Rectovaginal: Confirms               Anus:  normal sphincter tone, no lesions  Gae Dry chaperoned for the exam.  A:  Well Woman with normal exam  Prediabetes   P:   No pap this year  Mammogram due  Colonoscopy  Discussed breast self exam  Discussed calcium and vit D intake  Labs with primary  Repeat BP 130/72  Addendum: will check cervical cultures for STD, declines blood work.

## 2020-02-01 ENCOUNTER — Other Ambulatory Visit: Payer: Self-pay

## 2020-02-02 ENCOUNTER — Encounter: Payer: Self-pay | Admitting: Obstetrics and Gynecology

## 2020-02-02 ENCOUNTER — Ambulatory Visit (INDEPENDENT_AMBULATORY_CARE_PROVIDER_SITE_OTHER): Payer: 59 | Admitting: Obstetrics and Gynecology

## 2020-02-02 VITALS — BP 138/72 | HR 77 | Temp 98.2°F | Ht 62.0 in | Wt 181.0 lb

## 2020-02-02 DIAGNOSIS — Z113 Encounter for screening for infections with a predominantly sexual mode of transmission: Secondary | ICD-10-CM | POA: Diagnosis not present

## 2020-02-02 DIAGNOSIS — Z01419 Encounter for gynecological examination (general) (routine) without abnormal findings: Secondary | ICD-10-CM | POA: Diagnosis not present

## 2020-02-02 NOTE — Patient Instructions (Signed)

## 2020-02-02 NOTE — Addendum Note (Signed)
Addended by: Dorothy Spark on: 02/02/2020 01:10 PM   Modules accepted: Orders

## 2020-02-04 LAB — GC/CHLAMYDIA PROBE AMP
Chlamydia trachomatis, NAA: NEGATIVE
Neisseria Gonorrhoeae by PCR: NEGATIVE

## 2020-02-24 ENCOUNTER — Other Ambulatory Visit: Payer: Self-pay | Admitting: Obstetrics and Gynecology

## 2020-02-24 DIAGNOSIS — Z1231 Encounter for screening mammogram for malignant neoplasm of breast: Secondary | ICD-10-CM

## 2020-03-08 ENCOUNTER — Ambulatory Visit
Admission: RE | Admit: 2020-03-08 | Discharge: 2020-03-08 | Disposition: A | Discharge status: Home / Self Care | Payer: 59 | Source: Ambulatory Visit | Attending: Obstetrics and Gynecology | Admitting: Obstetrics and Gynecology

## 2020-03-08 ENCOUNTER — Other Ambulatory Visit: Payer: Self-pay

## 2020-03-08 DIAGNOSIS — Z1231 Encounter for screening mammogram for malignant neoplasm of breast: Secondary | ICD-10-CM | POA: Diagnosis not present

## 2020-03-29 ENCOUNTER — Other Ambulatory Visit: Payer: Self-pay

## 2020-03-29 ENCOUNTER — Ambulatory Visit: Payer: 59 | Admitting: Physician Assistant

## 2020-03-29 ENCOUNTER — Encounter: Payer: Self-pay | Admitting: Physician Assistant

## 2020-03-29 DIAGNOSIS — D2271 Melanocytic nevi of right lower limb, including hip: Secondary | ICD-10-CM | POA: Diagnosis not present

## 2020-03-29 DIAGNOSIS — Z86018 Personal history of other benign neoplasm: Secondary | ICD-10-CM

## 2020-03-29 DIAGNOSIS — Z1283 Encounter for screening for malignant neoplasm of skin: Secondary | ICD-10-CM | POA: Diagnosis not present

## 2020-03-29 DIAGNOSIS — Z87898 Personal history of other specified conditions: Secondary | ICD-10-CM

## 2020-03-29 DIAGNOSIS — D225 Melanocytic nevi of trunk: Secondary | ICD-10-CM | POA: Diagnosis not present

## 2020-03-29 DIAGNOSIS — Z85828 Personal history of other malignant neoplasm of skin: Secondary | ICD-10-CM

## 2020-03-29 DIAGNOSIS — D229 Melanocytic nevi, unspecified: Secondary | ICD-10-CM

## 2020-03-29 DIAGNOSIS — D2272 Melanocytic nevi of left lower limb, including hip: Secondary | ICD-10-CM

## 2020-03-29 NOTE — Patient Instructions (Signed)
Preventing Skin Cancer, Adult Skin cancer is the most common type of cancer. There are three main types. Squamous cell and basal cell skin cancer are the most common. Melanoma skin cancer is the most dangerous type. Most skin cancers are caused by skin damage from exposure to ultraviolet (UV) light. UV light comes from the sun and from artificial tanning beds. Suntans and sunburns result from exposure to UV light. Skin cancer occurs most often in older people, but it is usually the result of damage done earlier in life. The tans and sunburns you get at any age can lead to skin cancer in the future. To help prevent this, you can take steps to protect yourself. What actions can I take to protect myself from skin cancer? Many people like to get a tan, especially in the summer or when on vacation. However, tan or burned skin is a sign of skin damage. It increases your risk for skin cancer. To lower your risk: Avoid exposure to UV light   Try to stay out of the sun between 10 a.m. and 4 p.m. whenever possible. This is when the sun is at its strongest. Seek the shade during this time.  Remember that you can also be exposed to UV rays on cloudy or hazy days. Sun exposure can be risky year-round, not just in the summer.  Do not use a sunlamp, tanning bed, or tanning booth to get a tan. If you really want a tan, use an artificial tanning lotion.  Avoid getting sunburned. Sunburns are more common on bright sunny days, especially when you are in areas where the sun is reflected off water or snow. Use sunscreen and protective clothing   Always use sunscreen--either a cream, lotion, or spray--when you are out in the sun. Keep sunscreen handy, such as in your gym bag or in your car, so that you will have it when you need it.  Use a sunscreen with a sun protection factor (SPF) of at least 15. Use an SPF of 30 or higher if you are in bright sun, especially when you are out in the snow or on the water.  Make  sure your sunscreen protects you from UVA and UVB light.  Use an adequate amount of sunscreen to cover exposed areas of skin. Put it on 30 minutes before you go out. Reapply it every 2 hours or anytime you come out of the water.  When you are out in the sun, wear a broad-brimmed hat and clothing that covers your arms and legs. Wear wraparound sunglasses. Check your skin for changes  Check your skin often from head to toe to look for any changes in the size, color, or shape of any moles or freckles. Check for any new moles or moles that bleed or become itchy. See your health care provider if you notice changes.  Ask your health care provider about a total skin check. Ask if it should be part of your yearly physical or if you need to see a skin specialist (dermatologist). Take other preventive measures   Avoid exposure to harmful chemicals, such as arsenic. ? Have your home's water tested for arsenic and other chemicals. ? Take protective measures to avoid exposure to chemicals at work.  Do not smoke any tobacco products, such as cigarettes, cigars, pipes, and e-cigarettes. If you need help quitting, ask your health care provider.  Keep your immune system healthy. ? Stay up to date on all vaccines, including the human papillomavirus (HPV) vaccine. ?   Eat at least 5 servings of fruits and vegetables every day. Why are these changes important? About 1 of every 5 people will get skin cancer. The best way to reduce your risk is to avoid skin damage from UV light. If you have teenagers in your house, they should know that just five bad sunburns as a teen could double their risk of skin cancer in the future. If you have younger children, always make sure to protect their skin from the sun. These changes can help reduce your risk of skin cancer, and they will also provide other health benefits, such as the following:  Protecting your skin from the sun can help prevent painful sunburns, sun poisoning,  and other skin damage and blemishes. This is especially important if: ? You have pale white skin, freckles, and red hair. ? You burn easily.  Avoiding exposure to harmful chemicals can help prevent damage to other tissues in your body, such as your lungs, and prevent other types of cancer.  Avoiding smoking tobacco can reduce your risk for other types of cancer and other health problems.  Eating a healthy diet is good for your overall health. What can happen if changes are not made? If you do not make these changes, you will be at higher risk for skin cancer. If you develop skin cancer, the treatments could result in lost time from work and changes in your appearance from scars. The most dangerous type of skin cancer, melanoma, can be deadly if not found early. Where to find support For more support, talk to your primary health care provider or dermatologist. Where to find more information Learn more about skin cancer from:  The Skin Cancer Foundation: www.skincancer.org/prevention  The Centers for Disease Control and Prevention: www.cdc.gov/cancer/skin/  The American Academy of Dermatology: www.aad.org Summary  Skin cancer is the most common type of cancer.  Melanoma skin cancer can be deadly if not found early.  Sunburns and tanning increase your risk for skin cancer.  Protecting your skin from UV light is the best way to prevent skin cancer. This information is not intended to replace advice given to you by your health care provider. Make sure you discuss any questions you have with your health care provider. Document Revised: 11/27/2018 Document Reviewed: 09/29/2017 Elsevier Patient Education  2020 Elsevier Inc.  

## 2020-03-29 NOTE — Progress Notes (Signed)
   Follow up Visit  Subjective  Angela Garcia is a 59 y.o. female who presents for the following: Annual Exam.  Objective  Well appearing patient in no apparent distress; mood and affect are within normal limits.  A full examination was performed including head, eyes, ears, nose, lips, neck, chest, axillae, abdomen, back, buttocks, bilateral upper extremities, bilateral lower extremities, hands, feet, fingers, toes, fingernails, and toenails. All findings within normal limits unless otherwise noted below. No suspicious moles noted on back.   Objective  Chest - Medial [facility]): Total body skin exam  Objective  Left Upper Arm - Posterior: Surgical scar examined no sign of recurrence. 2018  Objective  Mid Back, Right Upper Arm - Posterior: Dyspigmented scar.   Objective  Left Thigh - Anterior, Mid Back, Right Breast, Right Thigh - Anterior: Tan-brown symmetric macules and papules.   Assessment & Plan  Skin exam for malignant neoplasm Chest - Medial [facility])  History of basal cell carcinoma (BCC) Left Upper Arm - Posterior  History of atypical nevus (2) Right Upper Arm - Posterior; Mid Back  Nevus (4) Left Thigh - Anterior; Right Thigh - Anterior; Right Breast; Mid Back  No treatment necessary today. No suspicious lesions noted.

## 2020-06-22 ENCOUNTER — Ambulatory Visit (HOSPITAL_COMMUNITY): Admission: RE | Admit: 2020-06-22 | Payer: 59 | Source: Ambulatory Visit

## 2020-06-22 ENCOUNTER — Ambulatory Visit (INDEPENDENT_AMBULATORY_CARE_PROVIDER_SITE_OTHER): Payer: 59 | Admitting: Osteopathic Medicine

## 2020-06-22 ENCOUNTER — Ambulatory Visit (HOSPITAL_COMMUNITY)
Admission: RE | Admit: 2020-06-22 | Discharge: 2020-06-22 | Disposition: A | Discharge status: Home / Self Care | Payer: 59 | Source: Ambulatory Visit | Attending: Osteopathic Medicine | Admitting: Osteopathic Medicine

## 2020-06-22 ENCOUNTER — Encounter: Payer: Self-pay | Admitting: Osteopathic Medicine

## 2020-06-22 ENCOUNTER — Telehealth: Payer: Self-pay | Admitting: Osteopathic Medicine

## 2020-06-22 ENCOUNTER — Other Ambulatory Visit: Payer: Self-pay

## 2020-06-22 VITALS — BP 156/95 | HR 71 | Temp 98.0°F | Wt 186.0 lb

## 2020-06-22 DIAGNOSIS — R9089 Other abnormal findings on diagnostic imaging of central nervous system: Secondary | ICD-10-CM | POA: Diagnosis not present

## 2020-06-22 DIAGNOSIS — G459 Transient cerebral ischemic attack, unspecified: Secondary | ICD-10-CM | POA: Diagnosis not present

## 2020-06-22 DIAGNOSIS — G9389 Other specified disorders of brain: Secondary | ICD-10-CM | POA: Diagnosis not present

## 2020-06-22 LAB — CP4508-PT/INR AND PTT: Prothrombin Time: 10.2 s (ref 9.0–11.5)

## 2020-06-22 LAB — CBC: Platelets: 436 10*3/uL — ABNORMAL HIGH (ref 140–400)

## 2020-06-22 MED ORDER — GADOBUTROL 1 MMOL/ML IV SOLN
8.0000 mL | Freq: Once | INTRAVENOUS | Status: AC | PRN
  Administered 2020-06-22: 8 mL via INTRAVENOUS

## 2020-06-22 MED ORDER — ASPIRIN EC 81 MG PO TBEC: 325.0000 mg | DELAYED_RELEASE_TABLET | Freq: Every day | ORAL | 1 refills | Status: DC

## 2020-06-22 MED ORDER — OLMESARTAN MEDOXOMIL 20 MG PO TABS: 20.0000 mg | ORAL_TABLET | Freq: Every day | ORAL | 1 refills | Status: DC

## 2020-06-22 MED ORDER — CLOPIDOGREL BISULFATE 75 MG PO TABS: 75.0000 mg | ORAL_TABLET | Freq: Every day | ORAL | 1 refills | Status: DC

## 2020-06-22 MED ORDER — ATORVASTATIN CALCIUM 40 MG PO TABS: 40.0000 mg | ORAL_TABLET | Freq: Every day | ORAL | 1 refills | Status: DC

## 2020-06-22 NOTE — Addendum Note (Signed)
Addended by: Maryla Morrow on: 06/22/2020 02:08 PM   Modules accepted: Orders

## 2020-06-22 NOTE — Telephone Encounter (Signed)
I called patient to give her information on her MRI appointments that were ordered stat. I let her know that she needed to arrive at 10:30 and the appointments were at 11, 12 and 1. She stated she did not like not being informed of the orders and she was at work and her boss was not there yet and she would do her best to make it to the appointment on time. - CF

## 2020-06-22 NOTE — Progress Notes (Addendum)
ADDENDUM 06/22/20 1:31 PM   Spoke w/ radiologist Concern for advances small vessel ischemic disease for age, possible CNS vascular disease (Behcet, Lupus), less likely demyelinating disease or unusual presentation of MS. Possibly due to poorly controlled HTN especially given tortuous appearance of major vessels.   Spoke to patient 2:07 PM explained MRI results, updated neurology referral to urgent. Pt amenable to Plavix/DAPT
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
## 2020-06-22 NOTE — Patient Instructions (Addendum)
Plan:  Concern for TIA / ministroke  Need: MRI, or CT if MRI not able to be done in next 1-2 days Labs today Heart workup: echocardiogram and heart monitor Neurology consultation  Start for stroke prevention:: Aspirin 325 mg (sent) Atorvastatin 40 mg (sent) BP medication (sent) to get BP to goal <140/90 Recommended Plavix as well as Aspirin, you have declined this Rx          .Marland Kitchen

## 2020-06-23 ENCOUNTER — Other Ambulatory Visit: Payer: Self-pay

## 2020-06-23 ENCOUNTER — Telehealth: Payer: Self-pay

## 2020-06-23 DIAGNOSIS — G459 Transient cerebral ischemic attack, unspecified: Secondary | ICD-10-CM

## 2020-06-23 LAB — LIPID PANEL
Cholesterol: 253 mg/dL — ABNORMAL HIGH (ref <200)
HDL: 69 mg/dL (ref >=50)
LDL Cholesterol (Calc): 157 mg/dL (calc) — ABNORMAL HIGH
Non-HDL Cholesterol (Calc): 184 mg/dL (calc) — ABNORMAL HIGH (ref <130)
Total CHOL/HDL Ratio: 3.7 (calc) (ref <5.0)
Triglycerides: 141 mg/dL (ref <150)

## 2020-06-23 LAB — COMPLETE METABOLIC PANEL WITH GFR
AG Ratio: 1.8 (calc) (ref 1.0–2.5)
ALT: 17 U/L (ref 6–29)
AST: 18 U/L (ref 10–35)
Albumin: 4.4 g/dL (ref 3.6–5.1)
Alkaline phosphatase (APISO): 101 U/L (ref 37–153)
BUN: 12 mg/dL (ref 7–25)
CO2: 27 mmol/L (ref 20–32)
Calcium: 9.4 mg/dL (ref 8.6–10.4)
Chloride: 101 mmol/L (ref 98–110)
Creat: 0.71 mg/dL (ref 0.50–1.05)
GFR, Est African American: 109 mL/min/{1.73_m2} (ref >=60)
GFR, Est Non African American: 94 mL/min/{1.73_m2} (ref >=60)
Globulin: 2.5 g/dL (calc) (ref 1.9–3.7)
Glucose, Bld: 120 mg/dL — ABNORMAL HIGH (ref 65–99)
Potassium: 4.2 mmol/L (ref 3.5–5.3)
Sodium: 138 mmol/L (ref 135–146)
Total Bilirubin: 1 mg/dL (ref 0.2–1.2)
Total Protein: 6.9 g/dL (ref 6.1–8.1)

## 2020-06-23 LAB — CBC
HCT: 41.1 % (ref 35.0–45.0)
Hemoglobin: 13.7 g/dL (ref 11.7–15.5)
MCH: 29.2 pg (ref 27.0–33.0)
MCHC: 33.3 g/dL (ref 32.0–36.0)
MCV: 87.6 fL (ref 80.0–100.0)
MPV: 9.8 fL (ref 7.5–12.5)
RBC: 4.69 10*6/uL (ref 3.80–5.10)
RDW: 12 % (ref 11.0–15.0)
WBC: 6.5 10*3/uL (ref 3.8–10.8)

## 2020-06-23 LAB — HEMOGLOBIN A1C
Hgb A1c MFr Bld: 6 % of total Hgb — ABNORMAL HIGH (ref <5.7)
Mean Plasma Glucose: 126 (calc)
eAG (mmol/L): 7 (calc)

## 2020-06-23 LAB — CP4508-PT/INR AND PTT
INR: 1
aPTT: 30 s (ref 23–32)

## 2020-06-23 LAB — TSH: TSH: 1.58 mIU/L (ref 0.40–4.50)

## 2020-06-23 NOTE — Telephone Encounter (Signed)
Pt called requesting if provider can change benicar to norvasc. Per pt, norvasc rx worked really well. Pt mentioned that she will be out of insurance soon and that the benicar rx is expensive. Pt will be picking up all her meds today except for the benicar rx. Pls send norvasc rx to the pharmacy.

## 2020-06-25 ENCOUNTER — Encounter: Payer: Self-pay | Admitting: Osteopathic Medicine

## 2020-06-26 ENCOUNTER — Other Ambulatory Visit: Payer: Self-pay

## 2020-06-26 DIAGNOSIS — G459 Transient cerebral ischemic attack, unspecified: Secondary | ICD-10-CM

## 2020-06-26 MED ORDER — AMLODIPINE BESYLATE 10 MG PO TABS: 10.0000 mg | ORAL_TABLET | Freq: Every day | ORAL | 3 refills | Status: DC

## 2020-06-26 NOTE — Telephone Encounter (Signed)
Changed to amlodipine.  Please remind patient that uncontrolled blood pressure can predispose to stroke and may be the reason that her brain MRI was abnormal.  I understand that she may have some financial limitations to adequate follow-up, but we can at least work with her by getting her in for nurse visit for blood pressure check in 2 weeks please make sure she is on the schedule.

## 2020-06-27 ENCOUNTER — Encounter: Payer: Self-pay | Admitting: Osteopathic Medicine

## 2020-06-27 ENCOUNTER — Other Ambulatory Visit: Payer: Self-pay

## 2020-06-27 MED ORDER — AMLODIPINE BESYLATE 10 MG PO TABS: 10.0000 mg | ORAL_TABLET | Freq: Every day | ORAL | 3 refills | Status: DC

## 2020-06-27 NOTE — Telephone Encounter (Signed)
Task completed. Pt informed via MyChart message.

## 2020-07-06 ENCOUNTER — Telehealth: Payer: Self-pay | Admitting: Osteopathic Medicine

## 2020-07-06 DIAGNOSIS — G459 Transient cerebral ischemic attack, unspecified: Secondary | ICD-10-CM

## 2020-07-06 NOTE — Telephone Encounter (Signed)
Yes, it is correct. It needed to be a future order.

## 2020-07-06 NOTE — Telephone Encounter (Signed)
Ordered Zio patch cardiac monitor - order bounced back, I placed order again can ou confirm this is right? Thanks

## 2020-07-18 DIAGNOSIS — G459 Transient cerebral ischemic attack, unspecified: Secondary | ICD-10-CM | POA: Insufficient documentation

## 2020-07-18 IMAGING — DX DG HAND COMPLETE 3+V*R*
3 series · 3 of 3 positions shown · non-contrast
Comparison: No recent prior.

CLINICAL DATA: Right hand pain and swelling.

EXAM:
RIGHT HAND - COMPLETE 3+ VIEW

[hand pa]
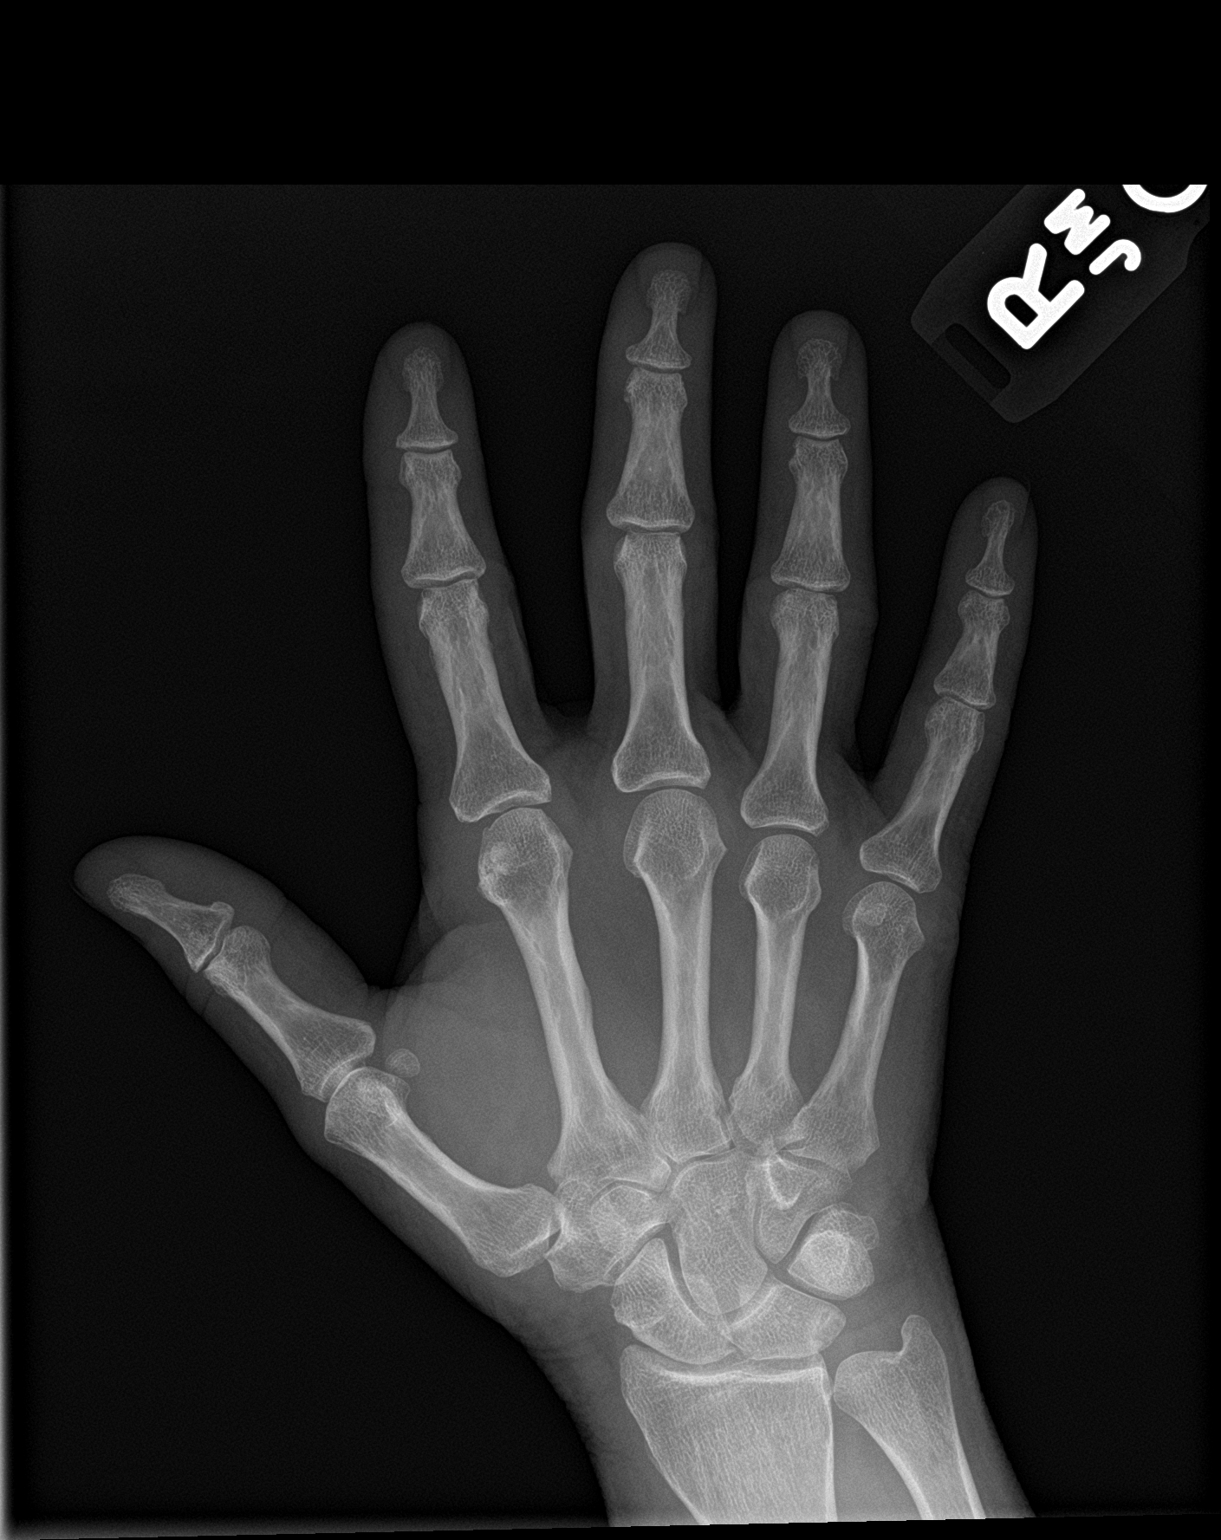

[hand obl]
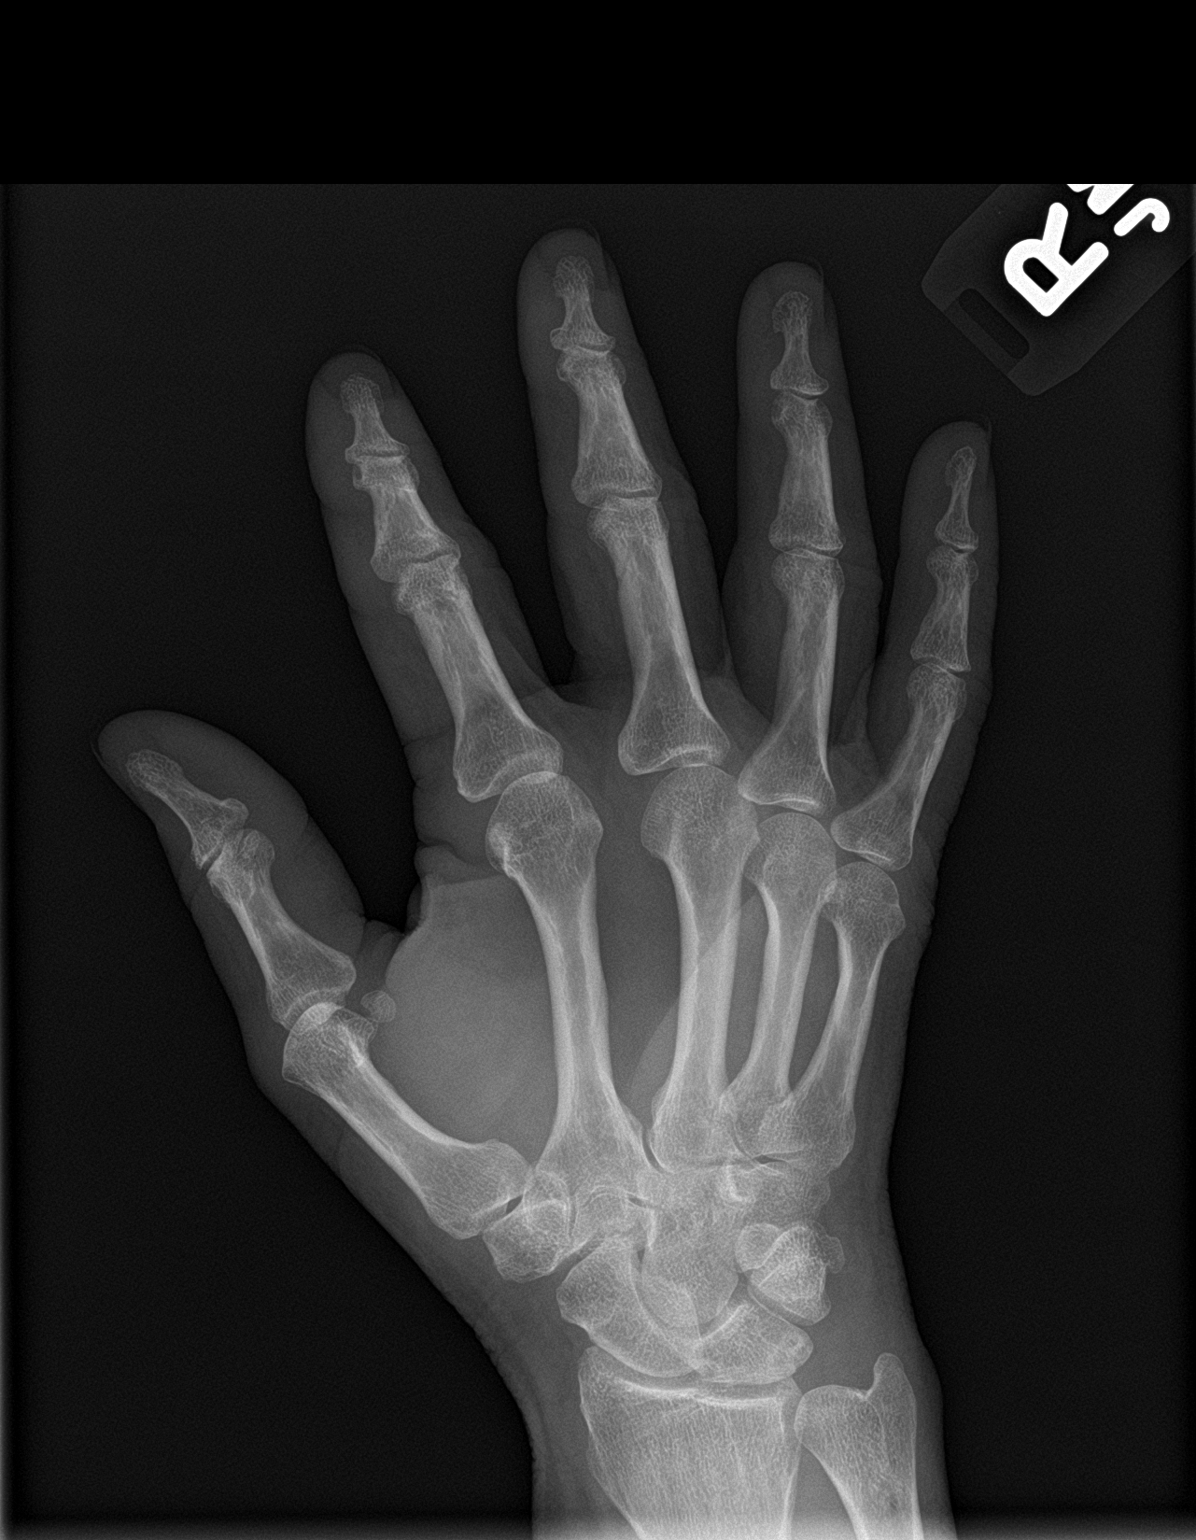

[hand lat]
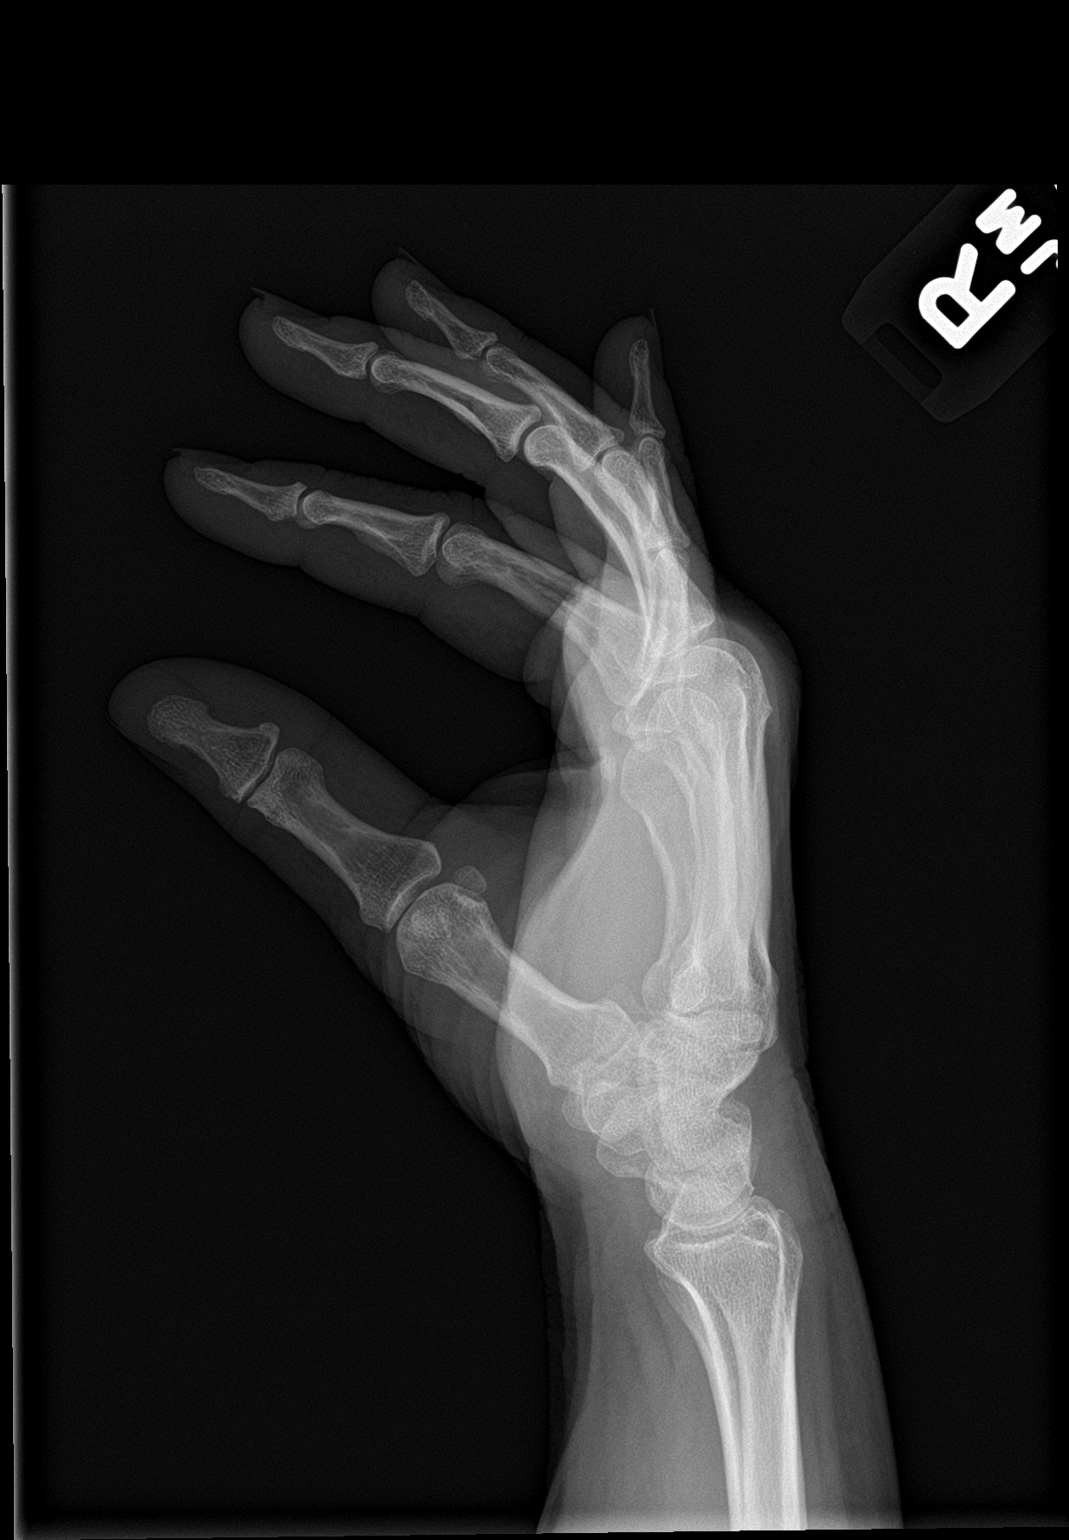

[3 of 3 positions shown; findings below may reference images not displayed]

FINDINGS: No acute bony or joint abnormality identified. No evidence of
fracture or dislocation. No radiopaque foreign body.
IMPRESSION: No acute abnormality.

## 2020-07-21 ENCOUNTER — Other Ambulatory Visit (HOSPITAL_BASED_OUTPATIENT_CLINIC_OR_DEPARTMENT_OTHER): Payer: 59

## 2020-07-21 ENCOUNTER — Telehealth: Payer: Self-pay | Admitting: Osteopathic Medicine

## 2020-07-21 ENCOUNTER — Encounter: Payer: Self-pay | Admitting: Osteopathic Medicine

## 2020-07-21 MED ORDER — HYDROCHLOROTHIAZIDE 12.5 MG PO TABS: 12.5000 mg | ORAL_TABLET | Freq: Every day | ORAL | 3 refills | Status: DC

## 2020-07-21 NOTE — Telephone Encounter (Signed)
Sorry, just reviewing this in detail now, I received verbal report earlier that a patient was having lower extremity swelling symptoms after starting a medication. I am sending a MyChart message to this patient, mild lower extremity swelling is a potential side effect of the amlodipine, she can stop this medication and I have sent in an alternative.

## 2020-07-21 NOTE — Telephone Encounter (Signed)
Attempted to contact pt to offer an appointment today at 1110 am. No answer, left a detailed vm msg for pt. Aware if she is unable to make the offered appt to report to UC for an evaluation. Direct call back info provided.

## 2020-07-21 NOTE — Telephone Encounter (Signed)
Dr Sheppard Coil, is this something that can maybe be addressed via phone note? She was seen on 06/22/20 and was started on Amlodipine on 06/27/20

## 2020-07-21 NOTE — Telephone Encounter (Signed)
Pt called for an appointment. She noticed since being on new meds Dr A put her on, her legs and ankles have been swollen. I have scheduled her appointment for Decmber 8th and I'm just wondering if it's okay for her to wait until then? Apolonio Schneiders asked that I defer this question to [place].

## 2020-07-26 ENCOUNTER — Other Ambulatory Visit: Payer: Self-pay

## 2020-07-26 ENCOUNTER — Ambulatory Visit (INDEPENDENT_AMBULATORY_CARE_PROVIDER_SITE_OTHER): Payer: 59 | Admitting: Osteopathic Medicine

## 2020-07-26 ENCOUNTER — Encounter: Payer: Self-pay | Admitting: Osteopathic Medicine

## 2020-07-26 VITALS — BP 129/62 | HR 65 | Temp 98.1°F | Wt 191.0 lb

## 2020-07-26 DIAGNOSIS — I10 Essential (primary) hypertension: Secondary | ICD-10-CM

## 2020-07-26 LAB — BASIC METABOLIC PANEL WITH GFR
BUN: 18 mg/dL (ref 7–25)
CO2: 30 mmol/L (ref 20–32)
Calcium: 9.5 mg/dL (ref 8.6–10.4)
Chloride: 101 mmol/L (ref 98–110)
Creat: 0.7 mg/dL (ref 0.50–1.05)
GFR, Est African American: 110 mL/min/{1.73_m2} (ref >=60)
GFR, Est Non African American: 95 mL/min/{1.73_m2} (ref >=60)
Glucose, Bld: 117 mg/dL — ABNORMAL HIGH (ref 65–99)
Potassium: 4.2 mmol/L (ref 3.5–5.3)
Sodium: 139 mmol/L (ref 135–146)

## 2020-07-26 NOTE — Progress Notes (Signed)
Angela Garcia is a 59 y.o. female who presents to  [place] at [facility]  today, 07/26/20, seeking care for the following:  . LE edema w/ BP Rx - recently reported LE swelling, I changed amlodipine to HCTZ and pt reports BP and LE edema have improved.  o She reports some persistent LE swelling, symmetrical bilaterally at ankle, no pitting, present upon waking. No orthopnea. No CP/SOB.  o On exam, WNL S1S2 RRR, CTABL no rales, LE slightly enlarged at ankles but no pitting edema      ASSESSMENT & PLAN with other pertinent findings:  The encounter diagnosis was Essential hypertension.   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  There are no Patient Instructions on file for this visit.  Orders Placed This Encounter  Procedures  . BASIC METABOLIC PANEL WITH GFR    No orders of the defined types were placed in this encounter.      Follow-up instructions: Return in about 6 months (around 01/24/2021) for ANNUAL CHECK-UP - SEE [place] SOONER IF NEEDED.                                         BP 129/62 (BP Location: Left Arm, Patient Position: Sitting, Cuff Size: Normal)   Pulse 65   Temp 98.1 F (36.7 C) (Oral)   Wt 191 lb 0.6 oz (86.7 kg)   LMP 10/04/2013   BMI 34.94 kg/m   Current Meds  Medication Sig  . aspirin EC 81 MG tablet Take 4 tablets (325 mg total) by mouth daily.  Marland Kitchen atorvastatin (LIPITOR) 40 MG tablet Take 1 tablet (40 mg total) by mouth daily.  . cetirizine (ZYRTEC) 10 MG tablet Take 10 mg by mouth daily. Reported on 09/27/2015  . Cholecalciferol (VITAMIN D3) 2000 UNITS capsule Take 2,000 Units by mouth daily.  . clopidogrel (PLAVIX) 75 MG tablet Take 1 tablet (75 mg total) by mouth daily.  .  hydrochlorothiazide (HYDRODIURIL) 12.5 MG tablet Take 1 tablet (12.5 mg total) by mouth daily.  Marland Kitchen MAGNESIUM PO Take 400 mg by mouth daily.   . Multiple Vitamins-Minerals (MULTIVITAMIN PO) Take 1 tablet by mouth.   Marland Kitchen oxymetazoline (AFRIN 12 HOUR) 0.05 % nasal spray     No results found for this or any previous visit (from the past 72 hour(s)).  No results found.     All questions at time of visit were answered - patient instructed to contact office with any additional concerns or updates.  ER/RTC precautions were reviewed with the patient as applicable.   Please note: voice recognition software was used to produce this document, and typos may escape review. Please contact Dr. Sheppard Coil for any needed clarifications.

## 2020-08-21 ENCOUNTER — Other Ambulatory Visit: Payer: Self-pay | Admitting: Osteopathic Medicine

## 2020-08-22 ENCOUNTER — Other Ambulatory Visit: Payer: Self-pay

## 2020-08-22 MED ORDER — CLOPIDOGREL BISULFATE 75 MG PO TABS: 75.0000 mg | ORAL_TABLET | Freq: Every day | ORAL | 1 refills | Status: DC

## 2020-08-22 MED ORDER — ATORVASTATIN CALCIUM 40 MG PO TABS: 40.0000 mg | ORAL_TABLET | Freq: Every day | ORAL | 1 refills | Status: DC

## 2020-09-08 ENCOUNTER — Ambulatory Visit: Payer: 59 | Admitting: Neurology

## 2020-09-20 ENCOUNTER — Ambulatory Visit: Payer: 59 | Admitting: Neurology

## 2021-01-22 ENCOUNTER — Other Ambulatory Visit: Payer: Self-pay | Admitting: Osteopathic Medicine

## 2021-02-01 ENCOUNTER — Ambulatory Visit (INDEPENDENT_AMBULATORY_CARE_PROVIDER_SITE_OTHER): Payer: 59 | Admitting: Osteopathic Medicine

## 2021-02-01 ENCOUNTER — Encounter: Payer: Self-pay | Admitting: Osteopathic Medicine

## 2021-02-01 ENCOUNTER — Other Ambulatory Visit: Payer: Self-pay

## 2021-02-01 VITALS — BP 146/78 | HR 65 | Temp 98.2°F | Wt 208.0 lb

## 2021-02-01 DIAGNOSIS — G459 Transient cerebral ischemic attack, unspecified: Secondary | ICD-10-CM | POA: Diagnosis not present

## 2021-02-01 DIAGNOSIS — I10 Essential (primary) hypertension: Secondary | ICD-10-CM

## 2021-02-01 DIAGNOSIS — Z0000 Encounter for general adult medical examination without abnormal findings: Secondary | ICD-10-CM | POA: Diagnosis not present

## 2021-02-01 LAB — COMPLETE METABOLIC PANEL WITH GFR
AG Ratio: 1.7 (calc) (ref 1.0–2.5)
ALT: 20 U/L (ref 6–29)
AST: 17 U/L (ref 10–35)
Albumin: 4.5 g/dL (ref 3.6–5.1)
Alkaline phosphatase (APISO): 95 U/L (ref 37–153)
BUN: 16 mg/dL (ref 7–25)
CO2: 30 mmol/L (ref 20–32)
Calcium: 9.9 mg/dL (ref 8.6–10.4)
Chloride: 102 mmol/L (ref 98–110)
Creat: 0.71 mg/dL (ref 0.50–1.05)
GFR, Est African American: 108 mL/min/{1.73_m2} (ref >=60)
GFR, Est Non African American: 93 mL/min/{1.73_m2} (ref >=60)
Globulin: 2.6 g/dL (calc) (ref 1.9–3.7)
Glucose, Bld: 123 mg/dL — ABNORMAL HIGH (ref 65–99)
Potassium: 4.4 mmol/L (ref 3.5–5.3)
Sodium: 140 mmol/L (ref 135–146)
Total Bilirubin: 0.8 mg/dL (ref 0.2–1.2)
Total Protein: 7.1 g/dL (ref 6.1–8.1)

## 2021-02-01 LAB — CBC
HCT: 41.6 % (ref 35.0–45.0)
Hemoglobin: 14.1 g/dL (ref 11.7–15.5)
MCH: 30.4 pg (ref 27.0–33.0)
MCHC: 33.9 g/dL (ref 32.0–36.0)
MCV: 89.7 fL (ref 80.0–100.0)
MPV: 10.1 fL (ref 7.5–12.5)
Platelets: 391 10*3/uL (ref 140–400)
RBC: 4.64 10*6/uL (ref 3.80–5.10)
RDW: 12.6 % (ref 11.0–15.0)
WBC: 8.2 10*3/uL (ref 3.8–10.8)

## 2021-02-01 LAB — LIPID PANEL
Cholesterol: 179 mg/dL (ref <200)
HDL: 67 mg/dL (ref >=50)
LDL Cholesterol (Calc): 87 mg/dL (calc)
Non-HDL Cholesterol (Calc): 112 mg/dL (calc) (ref <130)
Total CHOL/HDL Ratio: 2.7 (calc) (ref <5.0)
Triglycerides: 157 mg/dL — ABNORMAL HIGH (ref <150)

## 2021-02-01 LAB — TSH: TSH: 2.36 mIU/L (ref 0.40–4.50)

## 2021-02-01 MED ORDER — PHENTERMINE HCL 37.5 MG PO TABS: 18.7500 mg | ORAL_TABLET | Freq: Every day | ORAL | 0 refills | Status: AC

## 2021-02-01 MED ORDER — ATORVASTATIN CALCIUM 40 MG PO TABS: 1.0000 | ORAL_TABLET | Freq: Every day | ORAL | 3 refills | Status: DC

## 2021-02-01 MED ORDER — HYDROCHLOROTHIAZIDE 12.5 MG PO TABS: 12.5000 mg | ORAL_TABLET | Freq: Every day | ORAL | 3 refills | Status: DC

## 2021-02-01 MED ORDER — CLOPIDOGREL BISULFATE 75 MG PO TABS: 1.0000 | ORAL_TABLET | Freq: Every day | ORAL | 3 refills | Status: DC

## 2021-02-01 MED ORDER — ASPIRIN EC 81 MG PO TBEC: 81.0000 mg | DELAYED_RELEASE_TABLET | Freq: Every day | ORAL | 3 refills | Status: AC

## 2021-02-01 NOTE — Progress Notes (Signed)
Angela Garcia is a 60 y.o. female who presents to  [place] at [facility]  today, 02/01/21, seeking care for the following:  Annual physical Discuss weight loss - has been working on portion control, increasing exercise especially swimming. Concerned about HTN, knee pain and motivated to lose weight.  Wt Readings from Last 3 Encounters:  02/01/21 208 lb 0.6 oz (94.4 kg)  07/26/20 191 lb 0.6 oz (86.7 kg)  06/22/20 186 lb 0.6 oz (84.4 kg)        ASSESSMENT & PLAN with other pertinent findings:  The primary encounter diagnosis was Annual physical exam. Diagnoses of Essential hypertension and TIA (transient ischemic attack) were also pertinent to this visit.    There are no Patient Instructions on file for this visit.  Orders Placed This Encounter  Procedures   CBC   COMPLETE METABOLIC PANEL WITH GFR   Lipid panel   TSH    Meds ordered this encounter  Medications   aspirin EC 81 MG tablet    Sig: Take 1 tablet (81 mg total) by mouth daily.    Dispense:  90 tablet    Refill:  3   atorvastatin (LIPITOR) 40 MG tablet    Sig: Take 1 tablet (40 mg total) by mouth daily.    Dispense:  90 tablet    Refill:  3   clopidogrel (PLAVIX) 75 MG tablet    Sig: Take 1 tablet (75 mg total) by mouth daily.    Dispense:  90 tablet    Refill:  3   hydrochlorothiazide (HYDRODIURIL) 12.5 MG tablet    Sig: Take 1 tablet (12.5 mg total) by mouth daily.    Dispense:  90 tablet    Refill:  3   phentermine (ADIPEX-P) 37.5 MG tablet    Sig: Take 0.5 tablets (18.75 mg total) by mouth daily before breakfast. One tab by mouth qAM    Dispense:  30 tablet    Refill:  0   General Preventive Care Most recent routine screening labs: ordered.  Blood pressure goal 130/80 or less.  Tobacco: don't!  Alcohol: responsible moderation is ok for most adults - if you have concerns about your alcohol intake, please talk to me!  Exercise: as tolerated to reduce  risk of cardiovascular disease and diabetes. Strength training will also prevent osteoporosis.  Mental health: if need for mental health care (medicines, counseling, other), or concerns about moods, please let me know!  Sexual / Reproductive health: if need for STD testing, or if concerns with libido/pain problems, please let me know!  Advanced Directive: Living Will and/or Healthcare Power of Attorney recommended for all adults, regardless of age or health.  Vaccines Flu vaccine: for almost everyone, every fall.  Shingles vaccine: after age 27.  Pneumonia vaccines: after age 59. Tetanus booster: every 10 years  COVID vaccine: THANKS for getting your vaccine! :) / [place] screenings  Colon cancer screening: due 01/2024. Screening for everyone age 3-75. Colonoscopy available for all, many people also qualify for the Cologuard stool test  Breast cancer screening: mammogram annually or every other year after age 74.  Cervical cancer screening: Pap due 08/2022 Lung cancer screening: not needed for non-smokers  Infection screenings  HIV: recommended screening at least once age 49-65, more often as needed. Gonorrhea/Chlamydia, other STi screening as needed Hepatitis C: recommended once for everyone age 91-63 TB: certain at-risk populations, or depending on work requirements and/or travel history Other  Bone Density Test: recommended for women at age 37, men at age 53, sooner depending on risk factors Abdominal Aortic Aneurysm: screening with ultrasound recommended once for men age 15-75 who have ever smoked    Weight loss: important things to remember  Things to remember for exercise for weight loss:  Please note - I am not a certified Physiological scientist. I can present you with ideas and general workout goals, but an exercise program is largely up to you. Find something you can stick with, and something you enjoy!  Slow progression will help prevent injury! As you  progress in your exercise regimen think about gradually increasing the following, week by week:  intensity (how strenuous is your workout?) frequency (how often are you exercising?) duration (how many minutes at a time are you exercising?) Walking for 20 minutes a day is certainly better than nothing, but more strenuous exercise will develop better cardiovascular fitness.  interval training (high-intensity alternating with low-intensity, think walk/jog rather than just walk) muscle strengthening exercises (weight lifting, calisthenics, yoga) - this also helps prevent osteoporosis!   Things to remember for diet changes for weight loss:  Please note - I am not a certified dietician. I can present you with ideas and general diet goals, but a meal plan is largely up to you. I am happy to refer you to a dietician who can give you a detailed meal plan. Apps/logs can be helpful to track how you're eating! It's not realistic to be logging everything you eat forever, but when you're starting a healthy eating lifestyle it's very helpful, and checking in with logs now and then helps you stick to your program!  Calorie restriction / portion control with the goal weight loss of no more than one to one and a half pounds per week.  Increase lean protein such as chicken, fish, [place].  Decrease fatty foods such as dairy, butter.  Decrease sugary foods and sweets. Avoid sugary drinks such as soda or juice. Increase fiber found in fruit and vegetables, whole grains.   Medications approved for long-term use for obesity Qsymia (Phentermine + Topiramate) - may help migraines/headaches Saxenda (Liraglutide daily injections) - will also help diabetes/prediabetes Wegovy (Semaglutide weekly injections) - will also help diabetes/prediabetes Contrave (Bupropion + Naltrexone) - may help mental health/depression  Orlistat (Xenical Rx, Alli OTC) - not my favorite, can cause diarrhea Bupropion (Wellbutrin) - generic  antidepressant, will also help mental health, may help with quitting smoking  Plenity - mail order pharmacy will fill this, most insurance will not cover - this "expands" in the stomach to make people fill full I recommend that you research the above medications and see which one(s) your insurance may or may not cover: If you call your insurance, ask them specifically what medications are on their formulary that are approved for obesity treatment. They should be able to send you a list or tell you over the phone. Remember, medications aren't magic! You MUST be diligent about lifestyle changes as well!      See below for relevant physical exam findings  See below for recent lab and imaging results reviewed  Medications, allergies, PMH, PSH, SocH, [place] reviewed below    Follow-up instructions: Return in about 6 weeks (around 03/15/2021) for VIRTUAL *OR* IN-OFFICE VISIT, MEDICAL WEIGHT MANAGEMENT .  Exam:  BP (!) 146/78 (BP Location: Left Arm, Patient Position: Sitting, Cuff Size: Normal)   Pulse 65   Temp 98.2 F (36.8 C) (Oral)   Wt 208 lb 0.6 oz (94.4 kg)   LMP 10/04/2013   BMI 38.05 kg/m  Constitutional: VS see above. General Appearance: alert, well-developed, well-nourished, NAD Neck: No masses, trachea midline.  Respiratory: Normal respiratory effort. no wheeze, no rhonchi, no rales Cardiovascular: S1/S2 normal, no murmur, no rub/gallop auscultated. RRR.  Musculoskeletal: Gait normal. Symmetric and independent movement of all extremities Abdominal: non-tender, non-distended, no appreciable organomegaly, neg Murphy's, BS WNLx4 Neurological: Normal balance/coordination. No tremor. Skin: warm, dry, intact.  Psychiatric: Normal judgment/insight. Normal mood and affect. Oriented x3.   Current Meds  Medication Sig   cetirizine (ZYRTEC) 10 MG tablet Take 10 mg by mouth daily. Reported on 09/27/2015    Cholecalciferol (VITAMIN D3) 2000 UNITS capsule Take 2,000 Units by mouth daily.   MAGNESIUM PO Take 400 mg by mouth daily.    Multiple Vitamins-Minerals (MULTIVITAMIN PO) Take 1 tablet by mouth.    phentermine (ADIPEX-P) 37.5 MG tablet Take 0.5 tablets (18.75 mg total) by mouth daily before breakfast. One tab by mouth qAM   [DISCONTINUED] aspirin EC 81 MG tablet Take 4 tablets (325 mg total) by mouth daily.   [DISCONTINUED] atorvastatin (LIPITOR) 40 MG tablet Take 1 tablet by mouth once daily   [DISCONTINUED] clopidogrel (PLAVIX) 75 MG tablet Take 1 tablet by mouth once daily   [DISCONTINUED] hydrochlorothiazide (HYDRODIURIL) 12.5 MG tablet Take 1 tablet (12.5 mg total) by mouth daily.   [DISCONTINUED] oxymetazoline (AFRIN 12 HOUR) 0.05 % nasal spray     Allergies  Allergen Reactions   Clindamycin/Lincomycin    Codeine Nausea And Vomiting    Patient Active Problem List   Diagnosis Date Noted   Routine general medical examination at a health care facility 06/09/2015   Obesity 11/02/2013   History of irregular menstrual cycles 11/02/2013   Postmenopausal bleeding 11/02/2013   Diverticulitis 09/08/2012   Migraine headache 09/08/2012    Family History  Problem Relation Age of Onset   Cancer Mother        Ovarian/Cevical?   Lung cancer Mother    Ovarian cancer Mother    Lung cancer Father    Heart attack Father 55   Stroke Father        Late 40's   Hypertension Father    Ovarian cancer Sister    Heart disease Sister    Cancer Sister    Lung cancer Maternal Uncle    Heart attack Paternal Grandmother    Congestive Heart Failure Paternal Grandmother     Social History   Tobacco Use  Smoking Status Never  Smokeless Tobacco Never    Past Surgical History:  Procedure Laterality Date   CATARACT EXTRACTION  05/2011   Bilateral   CHOLECYSTECTOMY  2007   COLONOSCOPY  01/21/14   Normal - Every 10 years   CYST EXCISION Left 2002   Sebaceous removed from Breast - left benign    EYE SURGERY     HYSTEROSCOPY N/A 05/13/2014   Procedure: HYSTEROSCOPY with truclear  ;  Surgeon: Azalia Bilis, MD;  Location: [facility];  Service: Gynecology;  Laterality: N/A;    Immunization History  Administered Date(s) Administered   Influenza,inj,Quad PF,6+ Mos 07/04/2016, 04/23/2018   Influenza-Unspecified 05/30/2015   Moderna Sars-Covid-2 Vaccination 05/05/2020, 06/02/2020    No results found for this or any previous visit (from the past 2160 hour(s)).  No results found.     All questions at time of visit were answered - patient instructed to contact office with any additional concerns or updates. ER/RTC precautions were reviewed with the patient as applicable.   Please note: manual typing as well as voice recognition software may have been used to produce this document - typos may escape review. Please contact Dr. Sheppard Coil for any needed clarifications.

## 2021-02-01 NOTE — Progress Notes (Deleted)
60 y.o. G0P0000 Married White or Caucasian Not Hispanic or Latino female here for annual exam.      Patient's last menstrual period was 10/04/2013.          Sexually active: {yes no:314532}  The current method of family planning is {contraception:315051}.    Exercising: Yes.    {types:19826} Smoker:  {YES NO:22349}  Health Maintenance: Pap:  08-22-17 normal Neg HPV, History of abnormal Pap:  {YES NO:22349} MMG:  03-08-20 normal BMD:   never Colonoscopy: 2015 TDaP:  UTD Gardasil: no   reports that she has never smoked. She has never used smokeless tobacco. She reports previous alcohol use. She reports that she does not use drugs.  Past Medical History:  Diagnosis Date   Fibroid    Headache(784.0)    history - last one 4 months ago   Hypertension    PONV (postoperative nausea and vomiting)    Prediabetes     Past Surgical History:  Procedure Laterality Date   CATARACT EXTRACTION  05/2011   Bilateral   CHOLECYSTECTOMY  2007   COLONOSCOPY  01/21/14   Normal - Every 10 years   CYST EXCISION Left 2002   Sebaceous removed from Breast - left benign   EYE SURGERY     HYSTEROSCOPY N/A 05/13/2014   Procedure: HYSTEROSCOPY with truclear  ;  Surgeon: Azalia Bilis, MD;  Location: [facility];  Service: Gynecology;  Laterality: N/A;    Current Outpatient Medications  Medication Sig Dispense Refill   aspirin EC 81 MG tablet Take 1 tablet (81 mg total) by mouth daily. 90 tablet 3   atorvastatin (LIPITOR) 40 MG tablet Take 1 tablet (40 mg total) by mouth daily. 90 tablet 3   cetirizine (ZYRTEC) 10 MG tablet Take 10 mg by mouth daily. Reported on 09/27/2015     Cholecalciferol (VITAMIN D3) 2000 UNITS capsule Take 2,000 Units by mouth daily.     clopidogrel (PLAVIX) 75 MG tablet Take 1 tablet (75 mg total) by mouth daily. 90 tablet 3   hydrochlorothiazide (HYDRODIURIL) 12.5 MG tablet Take 1 tablet (12.5 mg total) by mouth daily. 90 tablet 3   MAGNESIUM PO Take 400 mg by mouth daily.       Multiple Vitamins-Minerals (MULTIVITAMIN PO) Take 1 tablet by mouth.      phentermine (ADIPEX-P) 37.5 MG tablet Take 0.5 tablets (18.75 mg total) by mouth daily before breakfast. One tab by mouth qAM 30 tablet 0   No current facility-administered medications for this visit.    Family History  Problem Relation Age of Onset   Cancer Mother        Ovarian/Cevical?   Lung cancer Mother    Ovarian cancer Mother    Lung cancer Father    Heart attack Father 52   Stroke Father        Late 52's   Hypertension Father    Ovarian cancer Sister    Heart disease Sister    Cancer Sister    Lung cancer Maternal Uncle    Heart attack Paternal Grandmother    Congestive Heart Failure Paternal Grandmother     Review of Systems  Exam:   LMP 10/04/2013   Weight change: @WEIGHTCHANGE @ Height:      Ht Readings from Last 3 Encounters:  02/02/20 5\' 2"  (1.575 m)  03/09/19 5\' 3"  (1.6 m)  01/27/19 5\' 2"  (1.575 m)    General appearance: alert, cooperative and appears stated age Head: Normocephalic, without obvious abnormality, atraumatic Neck: no  adenopathy, supple, symmetrical, trachea midline and thyroid {CHL AMB PHY EX THYROID NORM [phone]} Lungs: clear to auscultation bilaterally Cardiovascular: regular rate and rhythm Breasts: {Exam; breast:13139} Abdomen: soft, non-tender; non distended,  no masses,  no organomegaly Extremities: extremities normal, atraumatic, no cyanosis or edema Skin: Skin color, texture, turgor normal. No rashes or lesions Lymph nodes: Cervical, supraclavicular, and axillary nodes normal. No abnormal inguinal nodes palpated Neurologic: Grossly normal   Pelvic: External genitalia:  no lesions              Urethra:  normal appearing urethra with no masses, tenderness or lesions              Bartholins and Skenes: normal                 Vagina: normal appearing vagina with normal color and discharge, no lesions              Cervix: {CHL AMB PHY EX CERVIX NORM  [phone]}               Bimanual Exam:  Uterus:  {CHL AMB PHY EX UTERUS NORM DEFAULT:[phone]}              Adnexa: {CHL AMB PHY EX ADNEXA NO MASS DEFAULT:[phone]}               Rectovaginal: Confirms               Anus:  normal sphincter tone, no lesions chaperoned for the exam.  A:  Well Woman with normal exam  P:

## 2021-02-02 ENCOUNTER — Other Ambulatory Visit: Payer: Self-pay | Admitting: Osteopathic Medicine

## 2021-02-02 DIAGNOSIS — Z1231 Encounter for screening mammogram for malignant neoplasm of breast: Secondary | ICD-10-CM

## 2021-02-07 ENCOUNTER — Ambulatory Visit: Payer: 59 | Admitting: Obstetrics and Gynecology

## 2021-04-04 ENCOUNTER — Ambulatory Visit: Payer: 59

## 2021-04-10 ENCOUNTER — Other Ambulatory Visit: Payer: Self-pay | Admitting: Osteopathic Medicine

## 2021-04-13 ENCOUNTER — Encounter: Payer: Self-pay | Admitting: Obstetrics and Gynecology

## 2021-04-13 ENCOUNTER — Ambulatory Visit
Admission: RE | Admit: 2021-04-13 | Discharge: 2021-04-13 | Disposition: A | Discharge status: Home / Self Care | Payer: 59 | Source: Ambulatory Visit | Attending: Osteopathic Medicine | Admitting: Osteopathic Medicine

## 2021-04-13 ENCOUNTER — Other Ambulatory Visit: Payer: Self-pay

## 2021-04-13 ENCOUNTER — Ambulatory Visit (INDEPENDENT_AMBULATORY_CARE_PROVIDER_SITE_OTHER): Payer: 59 | Admitting: Obstetrics and Gynecology

## 2021-04-13 VITALS — BP 130/80 | HR 76 | Ht 63.0 in | Wt 201.0 lb

## 2021-04-13 DIAGNOSIS — Z1231 Encounter for screening mammogram for malignant neoplasm of breast: Secondary | ICD-10-CM

## 2021-04-13 DIAGNOSIS — Z01419 Encounter for gynecological examination (general) (routine) without abnormal findings: Secondary | ICD-10-CM | POA: Diagnosis not present

## 2021-04-13 NOTE — Progress Notes (Signed)
60 y.o. G0P0000 Married White or Caucasian Not Hispanic or Latino female here for annual exam.   Happily married. Good relationship with her stepdaughter (in college).   No vaginal bleeding. Sexually active, no pain as long as she uses a lubricant.   No bowel or bladder c/o.   Patient was diagnosed with hypertension last year and had a TIA in November. With all of the medication she was on she gained ~30 lbs, started Phentermine and has lost 7 lbs.     Patient's last menstrual period was 10/04/2013.          Sexually active: Yes.    The current method of family planning is post menopausal status.    Exercising: Yes.     [place]  Smoker:  no  Health Maintenance: Pap:  08/22/2017 WNL NEG HPV,  12-14-13 WNL  History of abnormal Pap:  no MMG:  04/13/21 not resulted yet BMD:   none Colonoscopy: 2015  TDaP:  up-to-date per patient  Gardasil: n/a   reports that she has never smoked. She has never used smokeless tobacco. She reports that she does not currently use alcohol. She reports that she does not use drugs. Has a couple of drink a week. 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 Living with her husband on a farm, has a pool, horses, 4 german shepherds  Past Medical History:  Diagnosis Date   Fibroid    Headache(784.0)    history - last one 4 months ago   Hypertension    PONV (postoperative nausea and vomiting)    Prediabetes     Past Surgical History:  Procedure Laterality Date   CATARACT EXTRACTION  05/2011   Bilateral   CHOLECYSTECTOMY  2007   COLONOSCOPY  01/21/14   Normal - Every 10 years   CYST EXCISION Left 2002   Sebaceous removed from Breast - left benign   EYE SURGERY     HYSTEROSCOPY N/A 05/13/2014   Procedure: HYSTEROSCOPY with truclear  ;  Surgeon: Azalia Bilis, MD;  Location: [facility];  Service: Gynecology;  Laterality: N/A;    Current Outpatient Medications  Medication Sig  Dispense Refill   aspirin EC 81 MG tablet Take 1 tablet (81 mg total) by mouth daily. 90 tablet 3   atorvastatin (LIPITOR) 40 MG tablet Take 1 tablet (40 mg total) by mouth daily. 90 tablet 3   cetirizine (ZYRTEC) 10 MG tablet Take 10 mg by mouth daily. Reported on 09/27/2015     Cholecalciferol (VITAMIN D3) 2000 UNITS capsule Take 2,000 Units by mouth daily.     clopidogrel (PLAVIX) 75 MG tablet Take 1 tablet (75 mg total) by mouth daily. 90 tablet 3   hydrochlorothiazide (HYDRODIURIL) 12.5 MG tablet Take 1 tablet (12.5 mg total) by mouth daily. 90 tablet 3   MAGNESIUM PO Take 400 mg by mouth daily.      Multiple Vitamins-Minerals (MULTIVITAMIN PO) Take 1 tablet by mouth.      phentermine (ADIPEX-P) 37.5 MG tablet Take 0.5 tablets (18.75 mg total) by mouth daily before breakfast. One tab by mouth qAM 30 tablet 0   No current facility-administered medications for this visit.    Family History  Problem Relation Age of Onset   Cancer Mother        Ovarian/Cevical?   Lung cancer Mother    Ovarian cancer Mother    Lung cancer Father  Heart attack Father 60   Stroke Father        Late 60's   Hypertension Father    Ovarian cancer Sister    Heart disease Sister    Cancer Sister    Lung cancer Maternal Uncle    Heart attack Paternal Grandmother    Congestive Heart Failure Paternal Grandmother     Review of Systems  All other systems reviewed and are negative.  Exam:   BP 130/80   Pulse 76   Ht '5\' 3"'$  (1.6 m)   Wt 201 lb (91.2 kg)   LMP 10/04/2013   SpO2 98%   BMI 35.61 kg/m   Weight change: '@WEIGHTCHANGE'$ @ Height:   Height: '5\' 3"'$  (160 cm)  Ht Readings from Last 3 Encounters:  04/13/21 '5\' 3"'$  (1.6 m)  02/02/20 '5\' 2"'$  (1.575 m)  03/09/19 '5\' 3"'$  (1.6 m)    General appearance: alert, cooperative and appears stated age Head: Normocephalic, without obvious abnormality, atraumatic Neck: no adenopathy, supple, symmetrical, trachea midline and thyroid normal to inspection and  palpation Lungs: clear to auscultation bilaterally Cardiovascular: regular rate and rhythm Breasts: normal appearance, no masses or tenderness Abdomen: soft, non-tender; non distended,  no masses,  no organomegaly Extremities: extremities normal, atraumatic, no cyanosis or edema Skin: Skin color, texture, turgor normal. No rashes or lesions Lymph nodes: Cervical, supraclavicular, and axillary nodes normal. No abnormal inguinal nodes palpated Neurologic: Grossly normal   Pelvic: External genitalia:  no lesions              Urethra:  normal appearing urethra with no masses, tenderness or lesions              Bartholins and Skenes: normal                 Vagina: normal appearing vagina with normal color and discharge, no lesions              Cervix: no lesions               Bimanual Exam:  Uterus:  normal size, contour, position, consistency, mobility, non-tender and anteverted              Adnexa: no mass, fullness, tenderness               Rectovaginal: declined  Caryn Bee, CMA chaperoned for the exam.  1. Well woman exam Discussed breast self exam Discussed calcium and vit D intake Mammogram just done Colonoscopy UTD Labs with primary

## 2021-04-13 NOTE — Patient Instructions (Signed)

## 2021-04-16 ENCOUNTER — Other Ambulatory Visit: Payer: Self-pay | Admitting: Osteopathic Medicine

## 2021-06-21 ENCOUNTER — Other Ambulatory Visit: Payer: Self-pay

## 2021-06-21 DIAGNOSIS — G459 Transient cerebral ischemic attack, unspecified: Secondary | ICD-10-CM

## 2021-07-19 ENCOUNTER — Inpatient Hospital Stay: Admission: RE | Admit: 2021-07-19 | Payer: Self-pay | Source: Ambulatory Visit

## 2022-05-14 NOTE — Progress Notes (Signed)
61 y.o. [place] Mar[place] White or Caucasian Not Hispanic or Latino female here for annual exam.  No vaginal bleeding. Sexually active, no pain.   No bowel or bladder c/o.    She had a breast reduction in 6/23. Feels so much better.   Patient's last menstrual period was 10/04/2013.          Sexually active: Yes.    The current method of family planning is post menopausal status.    Exercising:yes  [place] Smoker:  no  Health Maintenance: Pap:  08/22/17, WNL, HPV not detected, 12/14/13 WNL History of abnormal Pap:  no MMG:  04/19/21, BI-RAD 1 BMD:   none Colonoscopy: 01/21/14 TDaP:  up to date per patient Gardasil: none   reports that she has never smoked. She has never used smokeless tobacco. She reports current alcohol use. She reports that she does not use drugs. Rare ETOH. Does payroll and bookkeeping, has her own business.  Past Medical History:  Diagnosis Date   Fibroid    Headache(784.0)    history - last one 4 months ago   Hypertension    PONV (postoperative nausea and vomiting)    Prediabetes     Past Surgical History:  Procedure Laterality Date   BREAST REDUCTION SURGERY     01/2022   CATARACT EXTRACTION  05/20/2011   Bilateral   CHOLECYSTECTOMY  08/19/2005   COLONOSCOPY  01/21/2014   Normal - Every 10 years   CYST EXCISION Left 08/19/2000   Sebaceous removed from Breast - left benign   EYE SURGERY     HYSTEROSCOPY N/A 05/13/2014   Procedure: HYSTEROSCOPY with truclear  ;  Surgeon: Azalia Bilis, MD;  Location: [facility];  Service: Gynecology;  Laterality: N/A;    Current Outpatient Medications  Medication Sig Dispense Refill   aspirin EC 81 MG tablet Take 1 tablet (81 mg total) by mouth daily. 90 tablet 3   cetirizine (ZYRTEC) 10 MG tablet Take 10 mg by mouth daily. Reported on 09/27/2015     Cholecalciferol (VITAMIN D3) 2000 UNITS capsule Take 2,000 Units by mouth daily.     Multiple Vitamins-Minerals (MULTIVITAMIN PO) Take 1 tablet by mouth.       phentermine (ADIPEX-P) 37.5 MG tablet Take 0.5 tablets (18.75 mg total) by mouth daily before breakfast. One tab by mouth qAM 30 tablet 0   MOUNJARO 10 MG/0.5ML Pen SMARTSIG:10 Milligram(s) SUB-Q Once a Week     No current facility-administered medications for this visit.    Family History  Problem Relation Age of Onset   Cancer Mother        Ovarian/Cevical?   Lung cancer Mother    Ovarian cancer Mother    Lung cancer Father    Heart attack Father 2   Stroke Father        Late 50's   Hypertension Father    Ovarian cancer Sister    Heart disease Sister    Cancer Sister    Lung cancer Maternal Uncle    Heart attack Paternal Grandmother    Congestive Heart Failure Paternal Grandmother     Review of Systems  All other systems reviewed and are negative.   Exam:   BP 124/82 (BP Location: Left Arm, Patient Position: Sitting, Cuff Size: Normal)   Pulse 71   Ht '5\' 2"'$  (1.575 m)   Wt 146 lb (66.2 kg)   LMP 10/04/2013   SpO2 98%   BMI 26.70 kg/m   Weight change: '@WEIGHTCHANGE'$ @  Height:   Height: '5\' 2"'$  (157.5 cm)  Ht Readings from Last 3 Encounters:  05/17/22 '5\' 2"'$  (1.575 m)  04/13/21 '5\' 3"'$  (1.6 m)  02/02/20 '5\' 2"'$  (1.575 m)    General appearance: alert, cooperative and appears stated age Head: Normocephalic, without obvious abnormality, atraumatic Neck: no adenopathy, supple, symmetrical, trachea midline and thyroid normal to inspection and palpation Lungs: clear to auscultation bilaterally Cardiovascular: regular rate and rhythm Breasts: normal appearance, no masses or tenderness Abdomen: soft, non-tender; non distended,  no masses,  no organomegaly Extremities: extremities normal, atraumatic, no cyanosis or edema Skin: Skin color, texture, turgor normal. No rashes or lesions Lymph nodes: Cervical, supraclavicular, and axillary nodes normal. No abnormal inguinal nodes palpated Neurologic: Grossly normal   Pelvic: External genitalia:  no lesions              Urethra:   normal appearing urethra with no masses, tenderness or lesions              Bartholins and Skenes: normal                 Vagina: atrophic appearing vagina with normal color and discharge, no lesions              Cervix: no lesions               Bimanual Exam:  Uterus:  normal size, contour, position, consistency, mobility, non-tender              Adnexa: no mass, fullness, tenderness               Rectovaginal: Confirms               Anus:  normal sphincter tone, no lesions  Santiago Glad, CMA chaperoned for the exam.  1. Well woman exam Discussed breast self exam Discussed calcium and vit D intake Labs with primary Mammogram due, she will schedule it in a few months (still healing from a breast reduction)  2. Screening for cervical cancer - Cytology - PAP

## 2022-05-17 ENCOUNTER — Other Ambulatory Visit (HOSPITAL_COMMUNITY)
Admission: RE | Admit: 2022-05-17 | Payer: BLUE CROSS/BLUE SHIELD | Source: Ambulatory Visit | Admitting: Obstetrics and Gynecology

## 2022-05-17 ENCOUNTER — Ambulatory Visit (INDEPENDENT_AMBULATORY_CARE_PROVIDER_SITE_OTHER): Payer: BLUE CROSS/BLUE SHIELD | Admitting: Obstetrics and Gynecology

## 2022-05-17 ENCOUNTER — Encounter: Payer: Self-pay | Admitting: Obstetrics and Gynecology

## 2022-05-17 VITALS — BP 124/82 | HR 71 | Ht 62.0 in | Wt 146.0 lb

## 2022-05-17 DIAGNOSIS — Z01419 Encounter for gynecological examination (general) (routine) without abnormal findings: Secondary | ICD-10-CM

## 2022-05-17 DIAGNOSIS — Z124 Encounter for screening for malignant neoplasm of cervix: Secondary | ICD-10-CM

## 2022-05-17 NOTE — Patient Instructions (Signed)

## 2022-05-21 LAB — CYTOLOGY - PAP
Comment: NEGATIVE
Diagnosis: NEGATIVE
High risk HPV: NEGATIVE
